# Patient Record
Sex: Male | Born: 1940
Health system: Southern US, Community
[De-identification: ages and names within clinical notes are randomized; demographics above are authoritative.]

## PROBLEM LIST (undated history)

## (undated) DIAGNOSIS — I10 Essential (primary) hypertension: Secondary | ICD-10-CM

## (undated) DIAGNOSIS — K5790 Diverticulosis of intestine, part unspecified, without perforation or abscess without bleeding: Secondary | ICD-10-CM

## (undated) DIAGNOSIS — S82892A Other fracture of left lower leg, initial encounter for closed fracture: Secondary | ICD-10-CM

## (undated) DIAGNOSIS — E782 Mixed hyperlipidemia: Secondary | ICD-10-CM

## (undated) DIAGNOSIS — I2510 Atherosclerotic heart disease of native coronary artery without angina pectoris: Secondary | ICD-10-CM

## (undated) DIAGNOSIS — T7840XA Allergy, unspecified, initial encounter: Secondary | ICD-10-CM

## (undated) DIAGNOSIS — R011 Cardiac murmur, unspecified: Secondary | ICD-10-CM

## (undated) DIAGNOSIS — G001 Pneumococcal meningitis: Secondary | ICD-10-CM

## (undated) HISTORY — DX: Essential (primary) hypertension: I10

## (undated) HISTORY — PX: TONSILLECTOMY: SUR1361

## (undated) HISTORY — DX: Other fracture of left lower leg, initial encounter for closed fracture: S82.892A

## (undated) HISTORY — DX: Atherosclerotic heart disease of native coronary artery without angina pectoris: I25.10

## (undated) HISTORY — DX: Diverticulosis of intestine, part unspecified, without perforation or abscess without bleeding: K57.90

## (undated) HISTORY — DX: Mixed hyperlipidemia: E78.2

## (undated) HISTORY — DX: Cardiac murmur, unspecified: R01.1

## (undated) HISTORY — PX: ADENOIDECTOMY: SUR15

## (undated) HISTORY — DX: Allergy, unspecified, initial encounter: T78.40XA

## (undated) HISTORY — DX: Pneumococcal meningitis: G00.1

## (undated) HISTORY — PX: OTHER SURGICAL HISTORY: SHX169

## (undated) HISTORY — PX: COLONOSCOPY: SHX174

---

## 2001-08-11 ENCOUNTER — Other Ambulatory Visit: Admission: RE | Admit: 2001-08-11 | Discharge: 2001-08-11 | Payer: Self-pay | Admitting: Family Medicine

## 2004-09-30 ENCOUNTER — Ambulatory Visit: Payer: Self-pay | Admitting: Gastroenterology

## 2004-11-12 ENCOUNTER — Ambulatory Visit: Payer: Self-pay | Admitting: Gastroenterology

## 2007-05-21 HISTORY — PX: CORONARY ARTERY BYPASS GRAFT: SHX141

## 2007-05-22 ENCOUNTER — Ambulatory Visit: Payer: Self-pay | Admitting: Cardiology

## 2007-05-22 ENCOUNTER — Inpatient Hospital Stay (HOSPITAL_COMMUNITY): Admission: EM | Admit: 2007-05-22 | Discharge: 2007-05-26 | Payer: Self-pay | Admitting: Emergency Medicine

## 2007-05-23 ENCOUNTER — Ambulatory Visit: Payer: Self-pay | Admitting: Cardiothoracic Surgery

## 2007-06-09 ENCOUNTER — Ambulatory Visit: Payer: Self-pay | Admitting: Cardiology

## 2007-06-22 ENCOUNTER — Encounter: Admission: RE | Admit: 2007-06-22 | Discharge: 2007-06-22 | Payer: Self-pay | Admitting: Cardiothoracic Surgery

## 2007-06-22 ENCOUNTER — Ambulatory Visit: Payer: Self-pay | Admitting: Cardiothoracic Surgery

## 2007-07-10 ENCOUNTER — Ambulatory Visit: Payer: Self-pay | Admitting: Cardiology

## 2007-12-26 DIAGNOSIS — I2510 Atherosclerotic heart disease of native coronary artery without angina pectoris: Secondary | ICD-10-CM | POA: Insufficient documentation

## 2007-12-26 DIAGNOSIS — E785 Hyperlipidemia, unspecified: Secondary | ICD-10-CM | POA: Insufficient documentation

## 2010-04-12 ENCOUNTER — Encounter: Payer: Self-pay | Admitting: Cardiothoracic Surgery

## 2010-08-04 NOTE — Assessment & Plan Note (Signed)
[facility]                          [facility] OFFICE NOTE   AZAAN, LEASK                MRN:          [id number]  DATE:06/09/2007                            DOB:          1940-07-05    PRIMARY CARE PHYSICIAN:  Patrica Duel, M.D.   REASON FOR VISIT:  Follow up bypass surgery.   HISTORY OF PRESENT ILLNESS:  This is my first meeting with Mr. Bessinger.  He is a pleasant 70 year old gentleman, status post recent coronary  artery bypass grafting.  He presented back in early March 2009 with  symptoms of upper back and shoulder discomfort associated with shortness  of breath and was noted to have a significantly abnormal  electrocardiogram at presentation to see Dr. Nobie Putnam in the office.  He  was referred urgently to [facility] for a cardiac  catheterization, which was performed by Dr. Riley Kill.  This revealed  significant multivessel disease, with probable ruptured plaque in the  distal left main coronary artery, with an overall ejection fraction of  45%, associated with inferior segmental wall motion abnormality and very  mild mitral regurgitation.  He was seen by Dr. Donata Clay in consultation  and taken urgently for coronary artery bypass grafting, including a LIMA  to the left anterior descending, saphenous vein graft to the ramus,  saphenous vein graft to the right coronary artery, and saphenous vein  graft to the first and second obtuse marginal branches.  His  postoperative course was overall uncomplicated.  His rhythm remained  stable, and his medical therapy outlined below has been tolerated.  The  patient states he has been compliant with his medications and that has  not been doing any heavy lifting.  He had some trouble sleeping on his  back due to the fact that he has always been used to sleeping on his  side, although he states that his sternal wound is feeling much better.  He has had no progressive lower  extremity edema, although he does have  residual edema on the right, status post vein harvesting.  He has had no  orthopnea or palpitations.  Electrocardiogram shows sinus rhythm, with  left atrial enlargement and nonspecific ST-T wave changes.  I do not  have an old tracing and hand.  He is due to see Dr. Donata Clay next  Friday.   ALLERGIES:  NO KNOWN DRUG ALLERGIES.   PRESENT MEDICATIONS:  1. Aspirin 81 mg p.o. daily.  2. Plavix 75 mg p.o. daily.  3. Lisinopril 10 mg p.o. daily.  4. Metoprolol 25 mg p.o. b.i.d.  5. Simvastatin 20 mg p.o. at bedtime.   REVIEW OF SYSTEMS:  As described in the history of present illness.  Otherwise negative.   PHYSICAL EXAMINATION:  VITAL SIGNS:  Blood pressure is 135/81, heart  rate is 64, weight is 203 pounds.  GENERAL:  The patient is comfortable, looks well.  No acute distress.  HEENT:  Conjunctiva, lids normal.  Oropharynx clear.  NECK:  Supple.  No elevated jugular venous pressure, no loud bruits.  LUNGS:  Clear.  Somewhat diminished breath sounds at the very left base,  but otherwise good air movement.  CARDIAC:  Reveals a regular rate and rhythm.  No loud S3 years or  significant systolic murmur.  No pericardial rub.  ABDOMEN:  Soft.  Examination of the thorax shows a well-healing midline  sternal wound, nonpurulent, no erythema.  Upper abdominal keyhole  incisions are well healing with Steri-Strips in place.  No drainage.  EXTREMITIES:  Exhibit well-healing lower extremity vein harvest sites.  There is 1+ edema on the right, trace on the left.  Distal pulses  otherwise 2+.   IMPRESSION AND RECOMMENDATIONS:  Multivessel coronary disease, with an  ejection fraction of approximately 45%, status post urgent coronary  artery bypass grafting as outlined above.  The patient is recuperating  quite nicely.  His medical therapy is outlined above and is reasonable.  I discussed with him the importance of continuing regular medications  and having  followup long term.  He is due to see Dr. Donata Clay next week  and will have a chest x-ray at that time.  He is off diuretics, and plan  will be for him to continue to increase activity gradually.  I would  like to see him back in one month's time, at which point we will get a  follow-up lipid profile and liver function tests.  Otherwise, continue  regular followup with Dr. Nobie Putnam.     Jonelle Sidle, MD  Electronically Signed    SGM/MedQ  DD: 06/09/2007  DT: 06/09/2007  Job #: [id number]   cc:   Patrica Duel, M.D.

## 2010-08-04 NOTE — H&P (Signed)
NAMERONNALD, SHEDDEN               ACCOUNT NO.:  [id number]   MEDICAL RECORD NO.:  [id number]          PATIENT TYPE:  EMS   LOCATION:  MAJO                         FACILITY:  [facility]   PHYSICIAN:  Thomas C. Wall, MD, FACCDATE OF BIRTH:  [birth year]   DATE OF ADMISSION:  05/22/2007  DATE OF DISCHARGE:                              HISTORY & PHYSICAL   PRIMARY CARE PHYSICIAN:  Patrica Duel, M.D.   PRIMARY CARDIOLOGIST:  Rainey Pines. Wall, M.D.   HISTORY OF PRESENT ILLNESS:  This 70 year old obese Caucasian male  without any known history of coronary artery disease with complaints of  bilateral shoulder pain x3 weeks, most occurring in the morning when  awakens.  He has been noticing it becoming more severe, lasting from 5  minutes to 20 minutes and suddenly began to have a cough and congestion  approximately one week ago.  He was seen by his primary care physician  and x-ray was completed and was told that he had pneumonia and he was  placed on Rocephin and given an IM shot and some p.o. Levaquin.  The  patient continued to have some shoulder pain in the mornings becoming  progressively worse and he went back to see his primary care physician,  saw the P.A. there and an electrocardiogram was completed as the patient  continued to have shoulder pain and some abdominal discomfort along with  heartburn.  EKG revealed significant ST depression, leads V2, V3, V4,  V5, and V6 with evidence of posterior ischemia.  The patient was sent  immediately to [facility] emergency room.  He is currently not having  any chest discomfort.  He is mildly hypertensive with a blood pressure  initially at __________ 104/102.  The patient also had a low grade  temperature when he came in at 99.9 and is currently 101.4.  Blood  pressure now is 133/75.  He was given sublingual nitroglycerin en route  and baby aspirin.  On arrival in the emergency room the patient had  repeat of his EKG which revealed normal  sinus rhythm without evidence of  ischemia or ST depression as seen on EKG at Dr. Geanie Logan office and  through EMS monitoring en route.   The patient has never been seen by a cardiologist before.  He is  normally just followed by his primary care physician, which he does not  see often as well.  He sees primary care on rare occasions.   REVIEW OF SYSTEMS:  Positive for shoulder pain, heartburn, cough and  congestion.  Denies any syncope, near syncope, dyspnea on exertion or  diaphoresis.   PAST MEDICAL HISTORY:  1. Hypertension.  2. Hypercholesterolemia.  3. Obesity.  4. Diverticulosis.   PAST SURGICAL HISTORY:  Tonsillectomy as a child.   SOCIAL HISTORY:  He lives in [place] with his wife.  He is retired.  He  does not smoke.  He does not drink alcohol.   FAMILY HISTORY:  Mother died of a cerebral bleed after falling and  hitting her head.  Father is deceased after having multiple MI's.  He  has  no siblings, he is an only child.   CURRENT MEDICATIONS:  At home:  1. Metoprolol 50 mg b.i.d.  2. Caduet 5/20 mg daily.  3. Benicar 40 mg daily.  4. Thiamine 10 mg daily.   ALLERGIES:  No known drug allergies.   LABORATORY DATA:  Currently only available now are hemoglobin and  hematocrit at 14.9 and 43.6 with white blood cells 10.3 and platelet  count of 252,000.   PHYSICAL EXAMINATION:  VITAL SIGNS:  Blood pressure now 133/75, heart  rate 100, respirations 20, temperature 101.4.  HEENT:  Head is normocephalic, atraumatic.  Eyes: Pupils equal, round,  reactive to light and accommodation.  Mucous membranes mouth pink and  moist.  Tongue is midline.  NECK:  Supple.  No JVD.  No carotid bruits are appreciated.  CARDIOVASCULAR:  Tachycardic.  Regular rhythm and rate without murmurs,  rubs or gallops.  Pulses are 2+ and equal bilaterally.  There are no  abdominal bruits appreciated.  LUNGS:  Are essentially clear to auscultation without wheezes, rhonchi  or rales.  There is no  cough.  ABDOMEN:  Obese, mildly distended with 2+ bowel sounds.  EXTREMITIES:  Without cyanosis, clubbing or edema.  Dorsalis pedis  pulses and radial pulses are 2+ bilaterally.  NEUROLOGICAL:  Cranial nerves II-XII grossly intact.   EKG revealing significant ST depression, leads V2, V3, V4, V5, and V6  with mild elevation in lead III.   IMPRESSION:  1. 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  2. Hypertension.  3. Hypercholesterolemia.  4. Obesity.   PLAN:  The patient has been seen and examined by myself and Dr. Juanito Doom  in the emergency room.  The patient's EKG shows clear anterior lateral  ST depression indicative of inferior posterior myocardial infarction.  The patient also is having some shoulder pain intermittently x3 weeks  with recent treatment for pneumonia.  The patient returned to primary  care physician's office after no improvement in symptoms with abnormal  EKG.   The patient will be started on nitroglycerin, heparin, given baby  aspirin and given Lopressor 5 mg IV x3 with p.o. Lopressor thereafter.  Dr. Daleen Squibb spoke with the cardiac catheterization lab to add patient on  for cardiac catheterization this afternoon.  In the interim, the patient  will be monitored closely in the emergency room and placed in stepdown.  The patient has had a conversation with Dr. Daleen Squibb who explained the risks  and benefits to the patient. He verbalizes understanding and is willing  to be admitted and undergo cardiac catheterization.  More  recommendations will be completed per hospital course and results of  catheterization.      Bettey Mare. Lyman Bishop, NP      Jesse Sans. Daleen Squibb, MD, [facility]  Electronically Signed    KML/MEDQ  D:  05/22/2007  T:  05/22/2007  Job:  [id number]   cc:   Patrica Duel, M.D.  Thomas C. Wall, MD, [facility]

## 2010-08-04 NOTE — Assessment & Plan Note (Signed)
OFFICE VISIT   Fernando [name], Fernando [name]  DOB:  1940-11-20                                        June 22, 2007  CHART #:  [id number]   CURRENT PROBLEMS:  1. Status post emergency coronary artery bypass grafting x5 on May 22, 2007 for 95% stenosis of the left main with unstable angina.  2. Obesity.  3. LVEF of 45% with inferior hypokinesia.   CURRENT MEDICATIONS:  1. Aspirin 81 mg daily.  2. Lopressor 25 b.i.d.  3. Lisinopril 10 mg daily.  4. Zocor 20 mg daily.  5. Plavix 75 mg daily.  6. Multivitamin 1 daily.   HISTORY OF PRESENT ILLNESS:  Fernando [name] is a very nice 70 year old male  who presented with unstable angina and was found by catheterization to  have a tight left main stenosis and underwent emergency coronary artery  bypass grafting. At the time of surgery, the left IMA was grafted to the  LAD and vein grafts were placed at the diagonal, OM1, and OM2, and the  posterior descending. Postoperatively, he did well and was discharged  home on the 4th postoperative day in sinus rhythm, on room air, and  ambulating 300 to 400 feet at a time.  Since returning home, he has had  no recurrent chest pain and the surgical incisions are healing well. He  has lost 15 pounds and is interested in attending the outpatient  rehabilitation program at [facility] in [place]. He is a non-smoker.   PHYSICAL EXAMINATION:  VITAL SIGNS:  Blood pressure 150/88, pulse 80,  respiratory rate 18, saturation 99%.  GENERAL:  He is alert and pleasant. He looks great.  LUNGS:  Breath sounds are clear and equal. The sternum is stable and  well healed.  ABDOMEN:  Obese.  EXTREMITIES:  He has good range of motion of each upper extremity. The  leg incisions are healing well and there is no peripheral edema.  CARDIAC:  Rhythm is regular without S3, gallop or rub.   DIAGNOSTIC STUDIES:  A PA and lateral chest x-ray taken today shows  clear lung fields without pleural  effusion. The sternal wires are well  aligned and intact. The cardiac silhouette is stable.   PLAN:  The patient has had a good recovery in his first month after  emergency multi-vessel coronary artery bypass surgery. He is now able to  resume driving and light daily activities and is encouraged to attend  the outpatient rehabilitation program at [facility]. I told him  that he could stop taking the Plavix after his prescription runs out,  which was provided to him since he presented with acute coronary  syndrome. He knows he is to take the aspirin indefinitely. 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 Otherwise, he will return here as needed.   Kerin Perna, M.D.  Electronically Signed   PV/MEDQ  D:  06/22/2007  T:  06/22/2007  Job:  [id number]   cc:   Jonelle Sidle, MD

## 2010-08-04 NOTE — Discharge Summary (Signed)
Fernando [name], Fernando [name]               ACCOUNT NO.:  [id number]   MEDICAL RECORD NO.:  [id number]          PATIENT TYPE:  INP   LOCATION:  1843                         FACILITY:  [facility]   PHYSICIAN:  Thomas C. Wall, MD, FACCDATE OF BIRTH:  Jun 03, 1940   DATE OF ADMISSION:  05/22/2007  DATE OF DISCHARGE:                               DISCHARGE SUMMARY   ADDENDUM:  To STAT cardiology H&P.   PRIMARY CARDIOLOGIST:  Jesse Sans. Wall, MD, [facility].   PRIMARY CARE PHYSICIAN:  Patrica Duel, M.D.   Review of the patient's records revealed that there was an error in his  history.  In fact, the wrong documentation was placed in the chart.  The  patient has no prior cardiac history, no prior history of hypertension,  no prior history of diabetes or hypercholesterolemia.   The patient is on no medications at home with the exception of recent  doses of Levaquin secondary to presumed pneumonia.   LABORATORY:  Available at this time, hemoglobin 14.9, hematocrit 43.6,  white blood cells 10.3, platelets 252.  Sodium 137, potassium 4.3,  chloride 103, CO2 26, BUN 4, creatinine 0.71.  CK 58, MB 2.9, troponin  0.06.   This addendum is to correct errors stating the patient has a prior  history of hypertension, hypercholesterolemia.      Bettey Mare. Lyman Bishop, NP      Jesse Sans. Daleen Squibb, MD, [facility]  Electronically Signed    KML/MEDQ  D:  05/22/2007  T:  05/22/2007  Job:  [id number]

## 2010-08-04 NOTE — Cardiovascular Report (Signed)
NAMEROBERTS, BON               ACCOUNT NO.:  [id number]   MEDICAL RECORD NO.:  [id number]          PATIENT TYPE:  INP   LOCATION:  2550                         FACILITY:  [facility]   PHYSICIAN:  Arturo Morton. Riley Kill, MD, FACCDATE OF BIRTH:  02-01-1941   DATE OF PROCEDURE:  05/22/2007  DATE OF DISCHARGE:                            CARDIAC CATHETERIZATION   INDICATIONS:  Fernando [name] is a 70 year old gentleman who has had a  several week history of recurrent discomfort.  He came to the office  today and was noted to have marked precordial ST __________.  He was  seen by Dr. Nobie Putnam and transferred promptly to the cardiac unit at  [facility] where he was seen by Dr. Daleen Squibb.  Urgent cardiac  catheterization was recommended.   PROCEDURE:  1. Left heart catheterization  2. Selective coronary arteriography.  3. Selective left ventriculography.  4. Subclavian angiography.   DESCRIPTION OF PROCEDURE:  The patient was brought to the  catheterization laboratory and prepped and draped in the usual fashion.  Through an anterior puncture, the right femoral artery was easily  entered, and a 5-French sheath was placed.  Following this, views of the  left and right coronary arteries were obtained in multiple angiographic  projections.  Subclavian angiography was performed because of probable  need for revascularization surgery.  Central aortic and left ventricular  pressures were measured with pigtail, and  ventriculography was  performed in the RAO projection.  Following this, Dr. Daleen Squibb came into the  room and we summoned Dr. Donata Clay.  He came to the laboratory as well.  Based on the patient's anatomy, it was felt that he needs urgent  revascularization surgery.  He was taken to the holding area in  satisfactory clinical condition for further evaluation and preparation  for __________.  The patient tolerated the procedure extremely well, and  there were no major complications.  He was  somewhat emotional during the  course of the procedure, but the procedure and its findings were  thoroughly explained to the patient, subsequently to the wife, and they  were able to meet Dr. Donata Clay.   HEMODYNAMIC DATA:  1. Central aortic pressure was 163/100, mean 128.  2. Left ventricular pressure was 162/22.  3. With the elevated pressure as well as heart rate, intravenous      metoprolol was administered x2 at 2.5 mg.   There was no obvious gradient on pullback across the aortic valve.   ANGIOGRAPHIC DATA:  1. Ventriculography was done in the RAO projection.  Estimated      ejection fraction would be in the range of 45%.  There was a      segmental inferior wall motion abnormality.  There appeared to be      very mild mitral regurgitation.  2. The subclavian appeared to be patent as is the internal mammary.  3. There was fairly heavy calcification of the coronary arteries      including the proximal portions.  4. The left main was hazy.  There was disease involving the distal      left  main that extends up into the circumflex as well as LAD.      Whether this represents calcium and/or ruptured plaque and hazy      clot is unclear.  There is some slow filling into the marginal      branch which comes right out in the midst of this.  The hazy      irregularity does have the appearance potentially of clot.  5. The left anterior descending artery demonstrates an ostial 80%      stenosis.  This is best seen in the LAO caudal view.  Just beyond      this, there is about 50-70% narrowing and then there is a complex      bifurcational stenosis, and this is also best seen in the LAO      caudal view.  This constitutes the major diagonal as well as the      LAD, and the LAD would be estimated at about 80% and the diagonal      at 70-80% as well.  Fortunately, the distal left anterior      descending artery looks like a reasonable vessel for      revascularization.  Also, the diagonal  appears to be      revascularizable.  6. The circumflex, as noted, demonstrates haziness at the ostium that      extends back into the left main.  There is some slow filling into      the ramus and/or first marginal branch.  The second marginal branch      also has a high-grade 80% stenosis.  Both the first and second      marginal branches appear to be suitable for grafting.  There is 30-      40% narrowing in the AV circumflex leading into the third marginal      branch, and this vessel appears to be graftable as well.  7. The right coronary artery is a fairly large-caliber vessel.      Importantly, there is ostial calcification.  There is a small PDA      and posterolateral system and some perhaps retrograde filling of a      circumflex branch vessel.  The mid right coronary has probably a      60% area of focal stenosis.  This is best seen in the steep LAO      view.  There is an acute marginal branch, but it does not appear to      supply a significant amount of inferior myocardium, and this has      about 90% ostial narrowing.  Dr. Donata Clay and I reviewed the films      here in the laboratory.  8. The subclavian vessel appears to be widely patent with good runoff      into the internal mammary.  There is calcification of the aorta.      There also appears to be mild ostial narrowing of the vertebral.   CONCLUSION:  1. Preserved overall left ventricular function with an ejection      fraction of 45%, inferobasal wall motion abnormality, and mild      mitral regurgitation.  2. Possible ruptured plaque involving the distal left main, proximal      circumflex, ramus, and left anterior descending, as noted above.  3. Complex bifurcational stenosis of the left anterior descending and      diagonal best noted in the left anterior oblique caudal view.  4.  High-grade stenosis of the second obtuse marginal.  5. Mild disease of the third obtuse marginal and mid right coronary.    DISPOSITION:  The patient presented with an acute coronary syndrome.  His EKG is markedly abnormal, suggesting a posterior infarct initially,  and then with stabilization demonstrated normalization of the ST  segments.  He has been seen in consultation by Dr. Donata Clay.  Urgent  revascularization surgery will be considered.      Arturo Morton. Riley Kill, MD, [facility]  Electronically Signed     TDS/MEDQ  D:  05/22/2007  T:  05/23/2007  Job:  [id number]   cc:   Patrica Duel, M.D.  Thomas C. Wall, MD, [facility]  CV Laboratory  Kerin Perna, M.D.

## 2010-08-04 NOTE — Consult Note (Signed)
Fernando [name], [name]               ACCOUNT NO.:  [id number]   MEDICAL RECORD NO.:  [id number]          PATIENT TYPE:  INP   LOCATION:  2550                         FACILITY:  [facility]   PHYSICIAN:  Kerin Perna, M.D.  DATE OF BIRTH:  1941/02/28   DATE OF CONSULTATION:  05/22/2007  DATE OF DISCHARGE:                                 CONSULTATION   REQUESTING PHYSICIAN:  Fernando Morton. Riley Kill, MD, [facility].   PRIMARY CARE PHYSICIAN:  Fernando [name], M.D.   REASON FOR CONSULTATION:  Severe left main and three-vessel coronary  disease.   CHIEF COMPLAINT:  Shoulder pain.   HISTORY OF PRESENT ILLNESS:  I was asked to evaluate this 70 year old  white male nonsmoker for potential emergency coronary artery bypass  surgery for recently diagnosed severe left main and three-vessel  coronary artery disease.  The patient has been having shoulder and upper  back pain for the past three weeks.  He has been treated with  antibiotics (Levaquin) without much improvement.  He was seen by his  primary care physician today and a 12-lead EKG showed diffuse and severe  ST-segment depression in the anterior and lateral leads.  He was given  four 81 mg aspirin tablets and transported to [facility] where emergency cardiac catheterization by Dr. Bonnee Quin  demonstrated critical 95% stenosis of the left main with probable  thrombus, and 80% stenosis of the LAD diagonal bifurcation, and 80%  stenosis of the OM1 and OM II, and a 50-70% stenosis of the mid RCA.  There is inferior and apical hypokinesia.  The patient had improvement  in the ST-segment changes when he was given heparin and IV Lopressor.  Following cardiac catheterization, it was felt the patient would benefit  from emergency heart bypass surgery based on his coronary anatomy and  symptoms.  The 5-French sheath was left in the right femoral artery.   PAST MEDICAL HISTORY:  1. No known medicines.  2. No allergies.  3. Status post  tonsillectomy at age 62.  4. Left ankle fracture repair with a cast.   SOCIAL HISTORY:  Nonsmoker, nondrinker, retired Chartered certified accountant.  He is  married and lives with his wife.   REVIEW OF SYSTEMS:  No weight loss or fever.  No night sweats.  ENT:  Review is negative for dental problems or difficulty swallowing.  THORACIC:  Review is negative for chest trauma or abnormal chest x-ray.  He has had some cough recently.  CARDIAC:  Review is positive for his  unstable angina with back pain and neck pain.  No history cardiac murmur  or arrhythmia.  GI:  Review is positive diverticulosis negative for  hepatitis, jaundice or blood per rectum.  ENDOCRINE:  Review is negative  diabetes or thyroid disease.  UROLOGIC:  Review is negative for BPH or  kidney stones.  VASCULAR:  Review is negative DVT, claudication or TIA.  NEUROLOGIC:  Review is negative stroke or seizure.  HEMATOLOGIC:  Review  is negative bleeding disorder or blood transfusion.   PHYSICAL EXAMINATION:  VITAL SIGNS:  The patient is 5 feet  10 and weighs  215 pounds.  Blood pressure 140/70, pulse 80 and sinus.  GENERAL APPEARANCE:  That of a pleasant middle-aged male in the cath lab  in no acute distress.  He is anxious.  HEENT:  Exam is normocephalic.  NECK:  Without JVD, mass or bruit.  LYMPHATICS:  No palpable adenopathy.  LUNGS:  Breath sounds are clear.  THORACIC:  Exam is without deformity.  CARDIAC:  Exam is with regular rhythm without S3 gallop or murmur.  ABDOMEN:  Soft without pulsatile mass.  EXTREMITIES:  Positive pulses.  Negative for clubbing, cyanosis or  edema.  He has a sheath in the right groin.  NEUROLOGIC:  Exam is intact.   LABORATORY DATA:  I reviewed the coronary arteriograms with Dr. Riley [name]  and he had severe three-vessel disease, left main stenosis and mild to  moderate duction in LV function.   PLAN:  The patient will be prepared for emergency multivessel coronary  bypass surgery.  I discussed the  indications and benefits of this  operation in detail with the patient and his wife.  I reviewed the major  surgical issues including the location of the surgical incisions, use of  general anesthesia and cardiopulmonary bypass, and the expected  postoperative hospital recovery.  I also reviewed the risks of bleeding,  MI, stroke, blood transfusion requirement, and death.  He understands  the alternatives to the surgery and the reasons to proceed.      Kerin Perna, M.D.  Electronically Signed     PV/MEDQ  D:  05/22/2007  T:  05/23/2007  Job:  [id number]

## 2010-08-04 NOTE — Op Note (Signed)
NAMESHANNAN, GARFINKEL               ACCOUNT NO.:  [id number]   MEDICAL RECORD NO.:  [id number]          PATIENT TYPE:  INP   LOCATION:  2314                         FACILITY:  [facility]   PHYSICIAN:  Kerin Perna, M.D.  DATE OF BIRTH:  1940-05-21   DATE OF PROCEDURE:  05/22/2007  DATE OF DISCHARGE:                               OPERATIVE REPORT   OPERATION:  1. Emergency coronary artery bypass grafting x5 (left internal mammary      artery to left anterior descending, saphenous vein graft to      diagonal, sequential saphenous vein graft to obtuse marginal 1 and      obtuse marginal 2, saphenous vein graft to posterior descending).  2. Endoscopic harvest of the greater saphenous vein from the right leg      and the left leg.   SURGEON:  Kerin Perna, M.D.   ASSISTANT:  Coral Ceo, PA-C   ANESTHESIA:  General.   POSTOPERATIVE DIAGNOSIS:  Unstable angina with 95% stenosis of the left  main and three-vessel coronary disease.   INDICATIONS:  The patient is a 70 year old obese man with 2-3 weeks of  upper back and shoulder pain especially with exertion.  An EKG at his  primary care physician's today showed severe ST-segment changes and he  underwent emergency cardiac catheterization which showed a 95% stenosis  of the left main with probable thrombus as well as three-vessel coronary  disease.  His EF was 45-50% with some inferior apical hypokinesia.  He  is felt to be a candidate for emergency coronary bypass surgery.  I  examined the patient in the cath lab and discussed the situation with  the patient and his wife.  I discussed the expected benefits and  indications for coronary bypass surgery for treatment of his severe  coronary disease.  I reviewed the alternatives to surgical therapy as  well.  I discussed the major aspects of the planned procedure including  the location of the surgical incisions, use of general anesthesia and  cardiopulmonary bypass, and expected  postoperative recovery period.  I  reviewed the risks to him of MI, CVA, bleeding, blood transfusion  requirement, infection, and death.  He understood these implications and  agreed to proceed with surgery under what I felt was an informed  consent.   OPERATIVE FINDINGS:  Saphenous vein was harvested from both legs as it  became too small to use below the knee.  The mammary artery was tortuous  but had good flow.  The coronaries were heavily diseased.  The patient  received a unit of platelets after reversal of the heparin with  protamine due to persistent coagulopathy.   PROCEDURE:  The patient was brought to operating room and placed supine  on the operating table where general anesthesia was induced.  The chest,  abdomen and legs were prepped with Betadine and draped as a sterile  field.  A sternal incision was made as the saphenous vein was harvested  endoscopically from both legs.  The left internal mammary artery was  harvested as a pedicle graft from its origin at  the subclavian vessels.  The sternal retractor was placed and the pericardium was opened and  suspended.  Pursestrings were placed in the ascending aorta and right  atrium and heparin was administered.  When the vein had been harvested  and found to be adequate, the patient was cannulated and placed on  bypass.  The coronaries were identified for grafting and cardioplegia  catheters were placed for both antegrade and retrograde cold blood  cardioplegia.  The patient was cooled to 32 degrees.  The mammary artery  and vein grafts were prepared for the distal anastomoses.  Crossclamp  was applied.   800 mL of cold blood cardioplegia was delivered in split doses between  the antegrade aortic and retrograde coronary sinus catheters.  There is  good cardioplegic arrest and septal temperature dropped less than 10  degrees.  Cardioplegia was then delivered every 20 minutes or less while  the crossclamp was applied.   The  distal coronary anastomoses were performed.  First distal  anastomosis was to the posterior descending branch of the right  coronary.  This had a proximal 70% stenosis.  A reverse saphenous vein  was sewn end-to-side with running 7-0 Prolene with good flow through  graft.  The second distal anastomosis was to the diagonal branch of the  LAD.  This had a proximal 80% stenosis and a reverse saphenous vein was  sewn end-to-side with running 7-0 Prolene with good flow through graft.  The third and fourth distal anastomoses consisted of a sequential vein  graft to the OM1 and OM2.  The OM1 was a large 1.6-1.7 mm vessel with  proximal 80% stenosis.  A side-to-side anastomosis with the vein was  sewn with a running 7-0 Prolene with good flow through graft.  This was  then continued in a sequential fashion to the distal circumflex - OM2.  This was a smaller 1.4-mm vessel and the end of the vein was sewn end-to-  side with running 7-0 Prolene.  This good flow through the sequential  vein graft and cardioplegia was redosed.  The fourth distal anastomosis  was to the distal third of the LAD.  It was a 1.5 mm vessel the proximal  80% stenosis.  Left IMA pedicle was brought through an opening created  in the left lateral pericardium was brought down onto the LAD and sewn  end-to-side with running 8-0 Prolene.  There was good flow through the  anastomosis after briefly releasing the pedicle bulldog on the mammary  artery.  The mammary was secured to the epicardium and the bulldog was  reapplied.  Cardioplegia was redosed.   While the crossclamp was still in place, three proximal vein anastomoses  were performed on the ascending aorta using a 4.0-mm punch and running 7-  0 Prolene.  Prior to tying down the final proximal anastomosis, air was  vented from the coronaries with a dose of retrograde warm blood  cardioplegia.  The final proximal anastomosis was tied and the  crossclamp was removed.   The  heart was reperfused and rewarmed.  The heart was cardioverted back  to a regular rhythm.  Air was aspirated from the vein grafts and each  was checked and had good flow and hemostasis was documented at the  proximal and distal anastomoses.  Temporary pacing wires were applied.  When the patient was adequately rewarmed and reperfused, the lungs were  re-expanded.  The ventilator was resumed.  The patient was then weaned  from bypass without difficulty  with stable blood pressure and cardiac  output.  Protamine was administered without adverse reaction.  The  cannulas were removed.  The mediastinum was irrigated with warm  irrigation.  The leg incisions were irrigated and closed in a standard  fashion.  The superior pericardial fat was closed over the aorta.  Two  mediastinal and left pleural chest tube placed and brought through  separate incisions.  The sternum was closed with interrupted steel wire.  The pectoralis fascia was closed in running #1 Vicryl.  Subcutaneous and  skin layers closed with a running Vicryl and sterile dressings were  applied.  Total bypass time was 145 minutes with crossclamp time of 108  minutes.      Kerin Perna, M.D.  Electronically Signed     PV/MEDQ  D:  05/22/2007  T:  05/23/2007  Job:  [id number]   cc:   Arturo Morton. Riley Kill, MD, [facility]

## 2010-08-04 NOTE — Assessment & Plan Note (Signed)
[facility]                          [facility] OFFICE NOTE   Fernando [name], Fernando [name]                MRN:          [id number]  DATE:07/10/2007                            DOB:          20-Jul-1940    PRIMARY CARE PHYSICIAN:  Dr. Patrica Duel.   REASON FOR VISIT:  Coronary artery disease.   HISTORY OF PRESENT ILLNESS:  Fernando [name] comes back in with his wife.  Since his last visit, he saw Dr. Donata Clay.  He had a chest x-ray done  at that time that by report showed no evidence of significant pleural  effusions with intact sternal wires.  Fernando [name] continues to make good  progress.  He has since stopped his Plavix and is tolerating his  remaining medications.  We did do follow-up fasting lipid profile and  liver function tests, and his LDL was down to 99.  He had normal liver  function tests.  Today, we went over as medications, and I talked about  more aggressive cholesterol control over time.  He prefers to wait about  three more months to see how his LDL settles out before we increase his  simvastatin.  Otherwise, I also asked him to keep an eye on his blood  pressure.  He has not seen Dr. Nobie Putnam back in the office as yet.   ALLERGIES:  No known drug allergies.   MEDICATIONS:  1. Aspirin 81 mg p.o. daily.  2. Lisinopril 10 mg p.o. daily.  3. Metoprolol 25 mg p.o. b.i.d.  4. Simvastatin 20 mg p.o. daily.   REVIEW OF SYSTEMS:  As described in the present illness.  All others  negative.   PHYSICAL EXAMINATION:  BP 145/85, heart rate is 83, weight 204 pounds.  The patient is comfortable in no acute distress.  Examination of neck reveals no elevated jugular venous pressure, no loud  bruits.  LUNGS:  Are clear.  CHEST:  Wall is well-healed.  CARDIAC:  Reveals a regular rate and rhythm.  No S3 gallop.  No loud  murmur.  EXTREMITIES:  Exhibit trace edema.   IMPRESSION AND RECOMMENDATIONS:  Multivessel cardiovascular disease with  an  ejection fraction of 45% status post urgent coronary bypass grafting  in March 2009.  He is doing quite well and will continue his present  medications.  I have asked him to keep an eye on his blood pressure with  Dr. Nobie Putnam.  He may need further up titration of his ACE inhibitor.  He will also come back in over the next three months with a fasting  lipid profile and liver function tests, and we might need to advance his  simvastatin further at that time for goal LDL reduction around  70.  He sounds as if he is trying to modify his diet and stay active.  His wife is also helping him with this.  I asked him to make sure he  gets back in to see Dr. Nobie Putnam in the interim.     Jonelle Sidle, MD  Electronically Signed    SGM/MedQ  DD: 07/10/2007  DT: 07/10/2007  Job #: [id number]  cc:   Bonne Dolores, M.D.

## 2010-08-04 NOTE — Discharge Summary (Signed)
Fernando [name], Fernando [name] NO.:  [id number]   MEDICAL RECORD NO.:  [id number]          PATIENT TYPE:  INP   LOCATION:  2016                         FACILITY:  [facility]   PHYSICIAN:  Kerin Perna, M.D.  DATE OF BIRTH:  Jan 27, 1941   DATE OF ADMISSION:  05/22/2007  DATE OF DISCHARGE:  05/26/2007                               DISCHARGE SUMMARY   HISTORY OF THE PRESENT ILLNESS:  The patient is a 70 year old male who  was seen by his primary physician with complaints of bilateral shoulder  pain for approximately 3 weeks.  Electrocardiogram showed abnormal  changes with ST depressions.  He was sent emergently to the [facility] for further evaluation and treatment.   PAST MEDICAL HISTORY:  1. Obesity.  2. Diverticulitis.   PAST SURGICAL HISTORY:  Tonsillectomy.   SOCIAL HISTORY:  1. He is a nonsmoker.  2. He does not use alcohol.   ALLERGIES:  No known drug allergies.   MEDICATIONS PRIOR TO ADMISSION:  None.   FAMILY HISTORY/SOCIAL HISTORY/REVIEW OF SYMPTOMS AND PHYSICAL EXAM:  Please see the History & Physical done at the time of admission.   HOSPITAL COURSE:  The patient was admitted, taken urgently to cardiac  catheterization where he was found to have severe coronary artery  disease including a 95% left main lesion, 80% proximal LAD, 70% proximal  obtuse marginal #1, 70% mid right coronary artery and 75% diagonal  lesion.  Left ventricular ejection fraction was 50% with anterior apical  mild hypokinesis.  Due to these findings surgical consultation was  obtained and he was evaluated by Dr. Kathlee Nations Trigt.  He recommended  proceeding with urgent surgical revascularization.   PROCEDURE:  On May 22, 2007, patient was taken to the operating room,  at which time he underwent the following procedure:  Emergent coronary  artery bypass grafting x5.  The following grafts were placed:  1. Left internal mammary artery to the LAD.  2. Saphenous vein graft to  the ramus.  3. Saphenous vein graft to the right coronary artery.  4. Sequential saphenous vein graft to obtuse marginal 1 and obtuse      marginal 2.   The patient tolerated the procedure well, was taken to the surgical  intensive care unit in stable condition.   POSTOPERATIVE HOSPITAL COURSE:  The patient has overall done quite well.  He has remained hemodynamically stable.  He is responding well to a  gentle diuresis.  His oxygen has been weaned and he maintains good  saturations on room air.  He is tolerating routine cardiac  rehabilitation phase I modalities.  He has a mild  postoperative acute  blood loss anemia.  Most recent hemoglobin and hematocrit is stable at  11.1 and 32.5 respectively.  He has maintained normal sinus rhythm  without significant cardiac dysrhythmias.  He is felt to be stable for  discharge on today's date, May 26, 2007.   INSTRUCTIONS:  The patient will receive written instructions regarding  medications, activity, diet, wound care and followup.   FOLLOWUP:  Will include:  1. Dr. Zenaida Niece  Trigt in 3 weeks.  2. Dr. Juanito Doom in 3 weeks.  3. He is instructed to follow up with Dr. Nobie Putnam in 1 month.   MEDICATIONS AT DISCHARGE ARE AS FOLLOWS:  1. Aspirin 81 mg daily.  2. Lopressor 25 mg twice daily.  3. Lisinopril 10 mg daily.  4. Zocor 20 mg daily.  5. Lasix 40 mg daily for 5 days.  6. Potassium chloride 20 mEq daily for 5 days.  7. Plavix 75 mg daily.  8. Multivitamin one daily.  9. Ceftin 500 mg twice daily for an additional 5 days.   FINAL DIAGNOSIS:  Severe multivessel coronary artery disease as  described above, now status post surgical revascularization.   OTHER DIAGNOSES:  1. Acute blood loss anemia.  2. Obesity.  3. Diverticulosis.  4. History of previous tonsillectomy and adenoidectomy.  5. History of previous left ankle fracture without surgery.      Rowe Clack, P.A.-C.      Kerin Perna, M.D.  Electronically Signed     WEG/MEDQ  D:  05/26/2007  T:  05/26/2007  Job:  [id number]   cc:   Kerin Perna, M.D.  Thomas C. Daleen Squibb, MD, [facility]  Patrica Duel, M.D.

## 2010-12-14 LAB — POCT I-STAT 4, (NA,K, GLUC, HGB,HCT)
Glucose, Bld: 113 — ABNORMAL HIGH
Glucose, Bld: 144 — ABNORMAL HIGH
HCT: 31 — ABNORMAL LOW
HCT: 37 — ABNORMAL LOW
Hemoglobin: 12.6 — ABNORMAL LOW
Hemoglobin: 8.2 — ABNORMAL LOW
Hemoglobin: 8.2 — ABNORMAL LOW
Hemoglobin: 8.2 — ABNORMAL LOW
Operator id: 123161
Operator id: 3402
Operator id: 3402
Operator id: 3402
Potassium: 4.4
Potassium: 4.5
Sodium: 138
Sodium: 138
Sodium: 139
Sodium: 139

## 2010-12-14 LAB — CBC
HCT: 29.3 — ABNORMAL LOW
HCT: 30.6 — ABNORMAL LOW
HCT: 31.6 — ABNORMAL LOW
HCT: 32 — ABNORMAL LOW
HCT: 32.5 — ABNORMAL LOW
Hemoglobin: 10.1 — ABNORMAL LOW
Hemoglobin: 10.6 — ABNORMAL LOW
Hemoglobin: 10.8 — ABNORMAL LOW
Hemoglobin: 10.8 — ABNORMAL LOW
Hemoglobin: 11.1 — ABNORMAL LOW
MCHC: 33.8
MCHC: 34.1
MCHC: 34.1
MCHC: 34.2
MCHC: 34.3
MCHC: 34.6
MCV: 90.5
MCV: 91.1
MCV: 91.5
MCV: 91.8
MCV: 91.9
MCV: 92.2
Platelets: 150
Platelets: 154
Platelets: 161
Platelets: 166
Platelets: 177
Platelets: 252
RBC: 3.17 — ABNORMAL LOW
RBC: 3.38 — ABNORMAL LOW
RBC: 3.44 — ABNORMAL LOW
RBC: 3.5 — ABNORMAL LOW
RBC: 3.53 — ABNORMAL LOW
RDW: 12.7
RDW: 12.8
RDW: 12.8
RDW: 12.9
RDW: 13.1
RDW: 13.3
WBC: 11.9 — ABNORMAL HIGH
WBC: 13.2 — ABNORMAL HIGH
WBC: 13.3 — ABNORMAL HIGH
WBC: 14.2 — ABNORMAL HIGH
WBC: 15.4 — ABNORMAL HIGH

## 2010-12-14 LAB — BASIC METABOLIC PANEL
BUN: 3 — ABNORMAL LOW
BUN: 4 — ABNORMAL LOW
BUN: 6
BUN: 9
CO2: 22
CO2: 24
CO2: 26
CO2: 30
Calcium: 6.6 — ABNORMAL LOW
Calcium: 7.3 — ABNORMAL LOW
Calcium: 8.6
Calcium: 9.2
Chloride: 101
Chloride: 103
Chloride: 105
Chloride: 111
Creatinine, Ser: 0.6
Creatinine, Ser: 0.61
Creatinine, Ser: 0.71
Creatinine, Ser: 0.77
GFR calc Af Amer: >60
GFR calc Af Amer: >60
GFR calc Af Amer: >60
GFR calc non Af Amer: >60
GFR calc non Af Amer: >60
GFR calc non Af Amer: >60
Glucose, Bld: 116 — ABNORMAL HIGH
Glucose, Bld: 139 — ABNORMAL HIGH
Glucose, Bld: 99
Potassium: 3.2 — ABNORMAL LOW
Potassium: 3.9
Potassium: 4.4
Sodium: 135
Sodium: 136
Sodium: 137

## 2010-12-14 LAB — I-STAT 8, (EC8 V) (CONVERTED LAB)
BUN: 5 — ABNORMAL LOW
Chloride: 100
Glucose, Bld: 112 — ABNORMAL HIGH
Hemoglobin: 10.9 — ABNORMAL LOW
Potassium: 3.7
Sodium: 136
pH, Ven: 7.417 — ABNORMAL HIGH

## 2010-12-14 LAB — CREATININE, SERUM
Creatinine, Ser: 0.67
GFR calc Af Amer: >60
GFR calc non Af Amer: >60

## 2010-12-14 LAB — CROSSMATCH: Antibody Screen: NEGATIVE

## 2010-12-14 LAB — POCT I-STAT 3, ART BLOOD GAS (G3+)
Acid-base deficit: 1
Acid-base deficit: 2
Bicarbonate: 22.9
Bicarbonate: 23.5
Bicarbonate: 23.9
O2 Saturation: 100
O2 Saturation: 100
O2 Saturation: 100
Patient temperature: 36.5
Patient temperature: 36.7
TCO2: 24
TCO2: 25
TCO2: 26
pCO2 arterial: 36.1
pCO2 arterial: 37.1
pH, Arterial: 7.379
pH, Arterial: 7.408
pO2, Arterial: 108 — ABNORMAL HIGH
pO2, Arterial: 116 — ABNORMAL HIGH
pO2, Arterial: 326 — ABNORMAL HIGH
pO2, Arterial: 358 — ABNORMAL HIGH

## 2010-12-14 LAB — DIFFERENTIAL
Basophils Relative: 1
Eosinophils Absolute: 0
Eosinophils Relative: 0
Monocytes Absolute: 0.6
Monocytes Relative: 6

## 2010-12-14 LAB — BLOOD GAS, ARTERIAL
Acid-base deficit: 0.9
O2 Content: 2
O2 Saturation: 98.9
pCO2 arterial: 31.7 — ABNORMAL LOW

## 2010-12-14 LAB — HEMOGLOBIN AND HEMATOCRIT, BLOOD
HCT: 24.7 — ABNORMAL LOW
Hemoglobin: 8.6 — ABNORMAL LOW

## 2010-12-14 LAB — MAGNESIUM
Magnesium: 2.2
Magnesium: 2.6 — ABNORMAL HIGH

## 2010-12-14 LAB — APTT
aPTT: 31
aPTT: 33

## 2010-12-14 LAB — PROTIME-INR
INR: 1.4
Prothrombin Time: 17 — ABNORMAL HIGH

## 2010-12-14 LAB — CK TOTAL AND CKMB (NOT AT ARMC)
Relative Index: INVALID
Total CK: 58

## 2011-05-06 ENCOUNTER — Emergency Department (HOSPITAL_COMMUNITY)
Admission: EM | Admit: 2011-05-06 | Discharge: 2011-05-06 | Disposition: A | Discharge status: Home / Self Care | Payer: Medicare Other | Attending: Emergency Medicine | Admitting: Emergency Medicine

## 2011-05-06 ENCOUNTER — Encounter (HOSPITAL_COMMUNITY): Payer: Self-pay | Admitting: *Deleted

## 2011-05-06 ENCOUNTER — Emergency Department (HOSPITAL_COMMUNITY): Payer: Medicare Other

## 2011-05-06 ENCOUNTER — Other Ambulatory Visit: Payer: Self-pay

## 2011-05-06 DIAGNOSIS — R079 Chest pain, unspecified: Secondary | ICD-10-CM | POA: Insufficient documentation

## 2011-05-06 DIAGNOSIS — R002 Palpitations: Secondary | ICD-10-CM | POA: Insufficient documentation

## 2011-05-06 LAB — DIFFERENTIAL
Lymphocytes Relative: 43 % (ref 12–46)
Lymphs Abs: 3.4 10*3/uL (ref 0.7–4.0)
Monocytes Relative: 9 % (ref 3–12)
Neutro Abs: 3.7 10*3/uL (ref 1.7–7.7)
Neutrophils Relative %: 46 % (ref 43–77)

## 2011-05-06 LAB — CBC
Hemoglobin: 14.7 g/dL (ref 13.0–17.0)
MCH: 30.9 pg (ref 26.0–34.0)
RBC: 4.76 MIL/uL (ref 4.22–5.81)
WBC: 7.9 10*3/uL (ref 4.0–10.5)

## 2011-05-06 LAB — CARDIAC PANEL(CRET KIN+CKTOT+MB+TROPI)
CK, MB: 2.6 ng/mL (ref 0.3–4.0)
Relative Index: INVALID (ref 0.0–2.5)
Troponin I: <0.3 ng/mL (ref <0.30)

## 2011-05-06 LAB — BASIC METABOLIC PANEL
BUN: 9 mg/dL (ref 6–23)
CO2: 25 mEq/L (ref 19–32)
Chloride: 101 mEq/L (ref 96–112)
Glucose, Bld: 116 mg/dL — ABNORMAL HIGH (ref 70–99)
Potassium: 3.7 mEq/L (ref 3.5–5.1)

## 2011-05-06 MED ORDER — ASPIRIN 81 MG PO CHEW
324.0000 mg | CHEWABLE_TABLET | Freq: Once | ORAL | Status: AC
  Administered 2011-05-06: 324 mg via ORAL
  Filled 2011-05-06: qty 4

## 2011-05-06 NOTE — ED Provider Notes (Signed)
History     CSN: [id number]  Arrival date & time 05/06/11  1337   First MD Initiated Contact with Patient 05/06/11 1350      Chief Complaint  Patient presents with  . Chest Pain    (Consider location/radiation/quality/duration/timing/severity/associated sxs/prior treatment) Patient is a 71 y.o. male presenting with chest pain. The history is provided by the patient.  Chest Pain Primary symptoms include palpitations. Pertinent negatives for primary symptoms include no shortness of breath, no abdominal pain, no nausea and no vomiting.  The palpitations did not occur with shortness of breath.  Pertinent negatives for associated symptoms include no numbness and no weakness.    patient states she was on the way to the barbecue restaurant when he started to feel a strange feeling in his right chest. States it felt as if his heart fluttering. He states he became flushed and felt bad. He feels better now. He states is not pain. No lightheadedness. No dizziness. No cough. No fevers. No diaphoresis. His wife states he looked flushed and pale. 4 years ago he had a 5 vessel cardiac bypass. He states he did not have chest pain at that time, only back pain.  History reviewed. No pertinent past medical history.  Past Surgical History  Procedure Date  . Cardiac surgery   . Tonsillectomy     History reviewed. No pertinent family history.  History  Substance Use Topics  . Smoking status: Never Smoker   . Smokeless tobacco: Not on file  . Alcohol Use: No      Review of Systems  Constitutional: Negative for activity change and appetite change.  HENT: Negative for neck stiffness.   Eyes: Negative for pain.  Respiratory: Negative for chest tightness and shortness of breath.   Cardiovascular: Positive for chest pain and palpitations. Negative for leg swelling.  Gastrointestinal: Negative for nausea, vomiting, abdominal pain and diarrhea.  Genitourinary: Negative for flank pain.    Musculoskeletal: Negative for back pain.  Skin: Negative for rash.  Neurological: Negative for weakness, numbness and headaches.  Psychiatric/Behavioral: Negative for behavioral problems.    Allergies  Review of patient's allergies indicates no known allergies.  Home Medications  No current outpatient prescriptions on file.  BP 166/81  Pulse 77  Temp(Src) 98.4 F (36.9 C) (Oral)  Resp 18  Ht 5\' 10"  (1.778 m)  Wt 180 lb (81.647 kg)  BMI 25.83 kg/m2  SpO2 100%  Physical Exam  Nursing note and vitals reviewed. Constitutional: He is oriented to person, place, and time. He appears well-developed and well-nourished.  HENT:  Head: Normocephalic and atraumatic.  Eyes: EOM are normal. Pupils are equal, round, and reactive to light.  Neck: Normal range of motion. Neck supple.  Cardiovascular: Normal rate, regular rhythm and normal heart sounds.   No murmur heard. Pulmonary/Chest: Effort normal and breath sounds normal.       Median sternotomy scar  Abdominal: Soft. Bowel sounds are normal. He exhibits no distension and no mass. There is no tenderness. There is no rebound and no guarding.  Musculoskeletal: Normal range of motion. He exhibits no edema.  Neurological: He is alert and oriented to person, place, and time. No cranial nerve deficit.  Skin: Skin is warm and dry.  Psychiatric: He has a normal mood and affect.    ED Course  Procedures (including critical care time)  Labs Reviewed  BASIC METABOLIC PANEL - Abnormal; Notable for the following:    Glucose, Bld 116 (*)    All other  components within normal limits  CBC  DIFFERENTIAL  CARDIAC PANEL(CRET KIN+CKTOT+MB+TROPI)  TROPONIN I   Dg Chest 2 View  05/06/2011  *RADIOLOGY REPORT*  Clinical Data: Right-sided chest pain  CHEST - 2 VIEW  Comparison: 06/22/2007; 05/25/2007; 05/24/2007  Findings: Grossly unchanged borderline enlarged cardiac silhouette with persistent mild tortuosity of the descending thoracic aorta. Post  median sternotomy and CABG.  There is unchanged mild diffuse increased conspicuity of the pulmonary interstitium without new focal airspace opacities.  There is persistent mild elevation of the right hemidiaphragm.  No pleural effusion or pneumothorax. Unchanged bones.  IMPRESSION: No acute cardiopulmonary disease.  Original Report Authenticated By: Waynard Reeds, M.D.     1. Chest pain      Date: 05/06/2011  Rate: 92  Rhythm: normal sinus rhythm  QRS Axis: normal  Intervals: normal  ST/T Wave abnormalities: normal  Conduction Disutrbances:none  Narrative Interpretation:   Old EKG Reviewed: unchanged    MDM  Chest pain. Sudden onset of fluttering with a gas-like feeling in his right chest. EKG is unchanged and 2 sets of enzymes are negative. I doubt this is quiet heart disease, but he will need followup with cardiology. An appointment is made for him at 840 tomorrow morning with our cardiology. He'll be discharged        Juliet Rude. Rubin Payor, MD 05/06/11 1710

## 2011-05-06 NOTE — ED Notes (Signed)
Pt sitting up on side of bed, family at bedside, pt denies any complaints.

## 2011-05-06 NOTE — ED Notes (Signed)
Pt denies any complaints, pt and family updated on plan of care./

## 2011-05-06 NOTE — ED Notes (Signed)
Pt presents to er with c/o fluttering or oddness in the right side of his chest. Dr. Rubin Payor in room with pt upon pt arrival to department, see EDP assessment for further

## 2011-05-06 NOTE — ED Notes (Signed)
Dr. Pickering in room with pt and family 

## 2011-05-06 NOTE — ED Notes (Signed)
Sudden onset felt like fluttering in chest.

## 2011-05-06 NOTE — Discharge Instructions (Signed)
Chest Pain (Nonspecific) It is often hard to give a specific diagnosis for the cause of chest pain. There is always a chance that your pain could be related to something serious, such as a heart attack or a blood clot in the lungs. You need to follow up with your caregiver for further evaluation. CAUSES   Heartburn.   Pneumonia or bronchitis.   Anxiety and stress.   Inflammation around your heart (pericarditis) or lung (pleuritis or pleurisy).   A blood clot in the lung.   A collapsed lung (pneumothorax). It can develop suddenly on its own (spontaneous pneumothorax) or from injury (trauma) to the chest.  The chest wall is composed of bones, muscles, and cartilage. Any of these can be the source of the pain.  The bones can be bruised by injury.   The muscles or cartilage can be strained by coughing or overwork.   The cartilage can be affected by inflammation and become sore (costochondritis).  DIAGNOSIS  Lab tests or other studies, such as X-rays, an EKG, stress testing, or cardiac imaging, may be needed to find the cause of your pain.  TREATMENT   Treatment depends on what may be causing your chest pain. Treatment may include:   Acid blockers for heartburn.   Anti-inflammatory medicine.   Pain medicine for inflammatory conditions.   Antibiotics if an infection is present.   You may be advised to change lifestyle habits. This includes stopping smoking and avoiding caffeine and chocolate.   You may be advised to keep your head raised (elevated) when sleeping. This reduces the chance of acid going backward from your stomach into your esophagus.   Most of the time, nonspecific chest pain will improve within 2 to 3 days with rest and mild pain medicine.  HOME CARE INSTRUCTIONS   If antibiotics were prescribed, take the full amount even if you start to feel better.   For the next few days, avoid physical activities that bring on chest pain. Continue physical activities as  directed.   Do not smoke cigarettes or drink alcohol until your symptoms are gone.   Only take over-the-counter or prescription medicine for pain, discomfort, or fever as directed by your caregiver.   Follow your caregiver's suggestions for further testing if your chest pain does not go away.   Keep any follow-up appointments you made. If you do not go to an appointment, you could develop lasting (chronic) problems with pain. If there is any problem keeping an appointment, you must call to reschedule.  SEEK MEDICAL CARE IF:   You think you are having problems from the medicine you are taking. Read your medicine instructions carefully.   Your chest pain does not go away, even after treatment.   You develop a rash with blisters on your chest.  SEEK IMMEDIATE MEDICAL CARE IF:   You have increased chest pain or pain that spreads to your arm, neck, jaw, back, or belly (abdomen).   You develop shortness of breath, an increasing cough, or you are coughing up blood.   You have severe back or abdominal pain, feel sick to your stomach (nauseous) or throw up (vomit).   You develop severe weakness, fainting, or chills.   You have an oral temperature above 102 F (38.9 C), not controlled by medicine.  THIS IS AN EMERGENCY. Do not wait to see if the pain will go away. Get medical help at once. Call your local emergency services (911 in [place] Do not drive yourself to   the hospital. MAKE SURE YOU:   Understand these instructions.   Will watch your condition.   Will get help right away if you are not doing well or get worse.  Document Released: 12/16/2004 Document Revised: 11/18/2010 Document Reviewed: 10/12/2007 Exit[employer] Patient Information 2012 [employer]. 

## 2011-05-07 ENCOUNTER — Ambulatory Visit (INDEPENDENT_AMBULATORY_CARE_PROVIDER_SITE_OTHER): Payer: BLUE CROSS/BLUE SHIELD | Admitting: Cardiology

## 2011-05-07 ENCOUNTER — Encounter: Payer: Self-pay | Admitting: Cardiology

## 2011-05-07 VITALS — BP 176/90 | HR 88 | Resp 16 | Ht 69.0 in | Wt 179.0 lb

## 2011-05-07 DIAGNOSIS — R072 Precordial pain: Secondary | ICD-10-CM | POA: Insufficient documentation

## 2011-05-07 DIAGNOSIS — I2510 Atherosclerotic heart disease of native coronary artery without angina pectoris: Secondary | ICD-10-CM

## 2011-05-07 DIAGNOSIS — E785 Hyperlipidemia, unspecified: Secondary | ICD-10-CM

## 2011-05-07 DIAGNOSIS — I10 Essential (primary) hypertension: Secondary | ICD-10-CM | POA: Insufficient documentation

## 2011-05-07 MED ORDER — NITROGLYCERIN 0.4 MG SL SUBL: 0.4000 mg | SUBLINGUAL_TABLET | SUBLINGUAL | Status: DC | PRN

## 2011-05-07 MED ORDER — ASPIRIN EC 81 MG PO TBEC: 81.0000 mg | DELAYED_RELEASE_TABLET | Freq: Every day | ORAL | Status: AC

## 2011-05-07 NOTE — Assessment & Plan Note (Signed)
Recent LDL was 119, not optimal in light of CAD, although has generally been better with his changes in diet and exercise. He continues on omega-3 supplements. I also spoke with him about considering a low-dose statin. He can continue to review this at his followup.

## 2011-05-07 NOTE — Assessment & Plan Note (Signed)
Atypical in description, although has been recurrent, and he has not undergone any followup ischemic testing since CABG in 2009. Recent ECG reviewed and overall nonspecific. We discussed proceeding with a followup exercise Cardiolite to assess for ischemic burden following CABG. Follow up visit scheduled in the [place] office.

## 2011-05-07 NOTE — Patient Instructions (Signed)
**Note De-Identified Burdette Gergely Obfuscation** Your physician has recommended you make the following change in your medication: start taking Aspirin 81 mg daily and Nitroglycerin 0.4 mg as needed for chest pain (please follow directions on the bottle)  Your physician has requested that you have en exercise stress myoview. For further information please visit [email or web address] Please follow instruction sheet, as given.  Your physician recommends that you schedule a follow-up appointment in: 2 month at our [place] office.

## 2011-05-07 NOTE — Assessment & Plan Note (Signed)
Blood pressure elevated today. He was on antihypertensives in the past. I spoke with the patient and his wife today about more optimal blood pressure control as it relates to reduction in stroke and heart attack risk, also reduction in risk of developing CHF. I asked him to record blood pressures at home with their blood pressure monitor, and bring these to his followup visit. We also discussed salt restriction.

## 2011-05-07 NOTE — Assessment & Plan Note (Signed)
In speaking with Fernando [name] today, it is apparent that he is fairly resistant to taking regular medications. I did discuss with him at least taking an aspirin daily, and also provided him a prescription for p.r.n. Nitroglycerin.

## 2011-05-07 NOTE — Progress Notes (Signed)
   Clinical Summary Fernando [name] is a 71 y.o.male referred to the office after a recent ER visit. I last saw him in our [place] office back in 2009. He reports no regular medical followup over the last year and a half, has not been taking any regular medications other than omega 3 supplements. He does state that he has changed his diet significantly and is exercising, has lost a significant amount of weight. He showed me a recent LDL of 119.  He is here with his wife. He denies any reproducible exertional chest pain, however states that occasionally he feels a "raw" sensation in his right chest, had a recent episode associated with dizziness that prompted him to seek evaluation at the [facility] ER. I reviewed his ECG which was nonspecific, troponin was normal, chest x-ray showed no acute findings.  He has not undergone any interval ischemic testing since his CABG in 2009, reaching his four-year anniversary next month. Today we discussed his blood pressure, lipid status, also plan for establishing more regular followup and ischemic testing.   No Known Allergies  Past Medical History  Diagnosis Date  . 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 Essential hypertension, benign   . Mixed hyperlipidemia   . Diverticulosis   . Ankle fracture, left     Past Surgical History  Procedure Date  . 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 Donata Clay - LIMA to LAD, SVG to ramus, SVG to RCA, SVG to OM1 and OM 2  . Tonsillectomy   . Adenoidectomy     Family History  Problem Relation Age of Onset  . Coronary artery disease Father     Social History Mr. Ruhlman reports that he has never smoked. He has never used smokeless tobacco. Mr. Mandala reports that he does not drink alcohol.  Review of Systems No true sense of palpitations. No syncope. Reports stable appetite. States that he salts his foods. No orthopnea or PND. No lower extremity edema. No claudication.  Otherwise reviewed and negative.  Physical Examination Filed Vitals:   05/07/11 0849  BP: 176/90  Pulse: 88  Resp: 16   Normally nourished appearing male in no acute distress. HEENT: Conjunctiva and lids normal, oropharynx clear. Neck: Supple, no elevated JVP or carotid bruits, no thyromegaly. Lungs: Clear to auscultation, nonlabored breathing at rest. Cardiac: Regular rate and rhythm, no S3 or significant systolic murmur, no pericardial rub. Abdomen: Soft, nontender, bowel sounds present, no guarding or rebound. Extremities: No pitting edema, distal pulses 2+. Skin: Warm and dry. Musculoskeletal: No kyphosis. Neuropsychiatric: Alert and oriented x3, affect grossly appropriate.    Problem List and Plan

## 2011-05-10 ENCOUNTER — Other Ambulatory Visit: Payer: Self-pay | Admitting: Cardiology

## 2011-05-10 DIAGNOSIS — I2510 Atherosclerotic heart disease of native coronary artery without angina pectoris: Secondary | ICD-10-CM

## 2011-05-10 DIAGNOSIS — R072 Precordial pain: Secondary | ICD-10-CM

## 2011-05-17 ENCOUNTER — Telehealth: Payer: Self-pay | Admitting: Cardiology

## 2011-05-17 DIAGNOSIS — R072 Precordial pain: Secondary | ICD-10-CM

## 2011-05-17 NOTE — Telephone Encounter (Signed)
Please refer to my recent office note. Patient just reestablished, not on any regular cardiac medications for quite some time. In the past he was on both Lopressor 25 mg twice daily, and lisinopril 10 mg daily. Please get a record of his heart rate and blood pressures at home, and if he is willing, we can certainly resume therapy. We did discuss this at the visit. He can have a stress test done at either facility. It should be done in the near future.

## 2011-05-17 NOTE — Telephone Encounter (Addendum)
**Note De-Identified Jouri Threat Obfuscation** Pt. was to have a myoview at [place] office this morning but he and his wife states that he could not do b/c of his weakness and not feeling well today. Mrs. Tomich states that they are going to reschedule when he feels better and would like to have done at [facility]. Mrs. Kelly is advised that, since Mr. Perrell lives in [place] and is a [place] pt., we scheduled test at [place] office for his convenience. She states it does not matter where test is done at but pt. will continue to f/u in [place] office. She is concerned that pt's BP is increasing and wants to know if he needs to be on a BP med. Please advise./LV

## 2011-05-17 NOTE — Telephone Encounter (Signed)
PT WANTS TO KNOW IF HE SHOULD BE ON BP MEDICATION. HE WAS UNABLE TO DO STRESS TEST WAS JUST NOT UP TO DOING IT. WIFE STATES THATV HIS BP HAS BEEN CREEPING UP/TMJ

## 2011-05-18 NOTE — Telephone Encounter (Signed)
**Note De-Identified Alma Mohiuddin Obfuscation** Pt. Is advised to arrive at Radiology Dept. at [facility] at 9:15 on 05-21-11 for myoview. He verbalized understanding./LV

## 2011-05-21 ENCOUNTER — Ambulatory Visit (INDEPENDENT_AMBULATORY_CARE_PROVIDER_SITE_OTHER): Payer: Medicare Other

## 2011-05-21 ENCOUNTER — Encounter (HOSPITAL_COMMUNITY)
Admission: RE | Admit: 2011-05-21 | Discharge: 2011-05-21 | Disposition: A | Discharge status: Home / Self Care | Payer: Medicare Other | Source: Ambulatory Visit | Attending: Cardiology | Admitting: Cardiology

## 2011-05-21 ENCOUNTER — Encounter (HOSPITAL_COMMUNITY): Payer: Self-pay

## 2011-05-21 DIAGNOSIS — R072 Precordial pain: Secondary | ICD-10-CM

## 2011-05-21 DIAGNOSIS — I2510 Atherosclerotic heart disease of native coronary artery without angina pectoris: Secondary | ICD-10-CM

## 2011-05-21 DIAGNOSIS — I10 Essential (primary) hypertension: Secondary | ICD-10-CM | POA: Insufficient documentation

## 2011-05-21 DIAGNOSIS — E785 Hyperlipidemia, unspecified: Secondary | ICD-10-CM | POA: Insufficient documentation

## 2011-05-21 DIAGNOSIS — R079 Chest pain, unspecified: Secondary | ICD-10-CM | POA: Insufficient documentation

## 2011-05-21 DIAGNOSIS — Z951 Presence of aortocoronary bypass graft: Secondary | ICD-10-CM | POA: Insufficient documentation

## 2011-05-21 MED ORDER — TECHNETIUM TC 99M TETROFOSMIN IV KIT
30.0000 | PACK | Freq: Once | INTRAVENOUS | Status: AC | PRN
  Administered 2011-05-21: 32 via INTRAVENOUS

## 2011-05-21 MED ORDER — TECHNETIUM TC 99M TETROFOSMIN IV KIT
10.0000 | PACK | Freq: Once | INTRAVENOUS | Status: AC | PRN
  Administered 2011-05-21: 9.5 via INTRAVENOUS

## 2011-05-21 NOTE — Progress Notes (Signed)
Stress Lab Nurses Notes - Fernando [name]  Fernando [name] 05/21/2011  Reason for doing test: CAD and Chest Pain Type of test: Stress Myoview Nurse performing test: Parke Poisson, RN Nuclear Medicine Tech: Lou Cal Echo Tech: Not Applicable MD performing test: Ival Bible & Joni Reining NP Family MD:  NPCP Test explained and consent signed: yes IV started: 22g jelco, Saline lock flushed, No redness or edema and Saline lock started in radiology Symptoms: fatigue Treatment/Intervention: None Reason test stopped: fatigue and reached target HR After recovery IV was: Discontinued via X-ray tech and No redness or edema Patient to return to Nuc. Med at : 12:40 Patient discharged: Home Patient's Condition upon discharge was: stable Comments: During test peak BP 198/72 & HR 146.  Recovery BP 162/78 & HR 92.  Symptoms resolved in recovery. Erskine Speed T

## 2011-06-02 ENCOUNTER — Telehealth: Payer: Self-pay | Admitting: Cardiology

## 2011-06-02 NOTE — Telephone Encounter (Signed)
Results of myoview/tg °

## 2011-06-02 NOTE — Telephone Encounter (Signed)
**Note De-Identified Jeneen Doutt Obfuscation** Mary, pt's wife advised of myoview results, she verbalized understanding./LV

## 2011-11-23 ENCOUNTER — Encounter: Payer: Self-pay | Admitting: Internal Medicine

## 2012-03-22 DIAGNOSIS — G001 Pneumococcal meningitis: Secondary | ICD-10-CM

## 2012-03-22 HISTORY — DX: Pneumococcal meningitis: G00.1

## 2012-07-20 ENCOUNTER — Encounter: Payer: Self-pay | Admitting: Internal Medicine

## 2012-10-27 IMAGING — CR DG CHEST 2V
2 series · 2 of 2 positions shown · non-contrast
Comparison: 06/22/2007; 05/25/2007; 05/24/2007

CLINICAL DATA: Right-sided chest pain

CHEST - 2 VIEW

[view not recorded (1 of 2)]
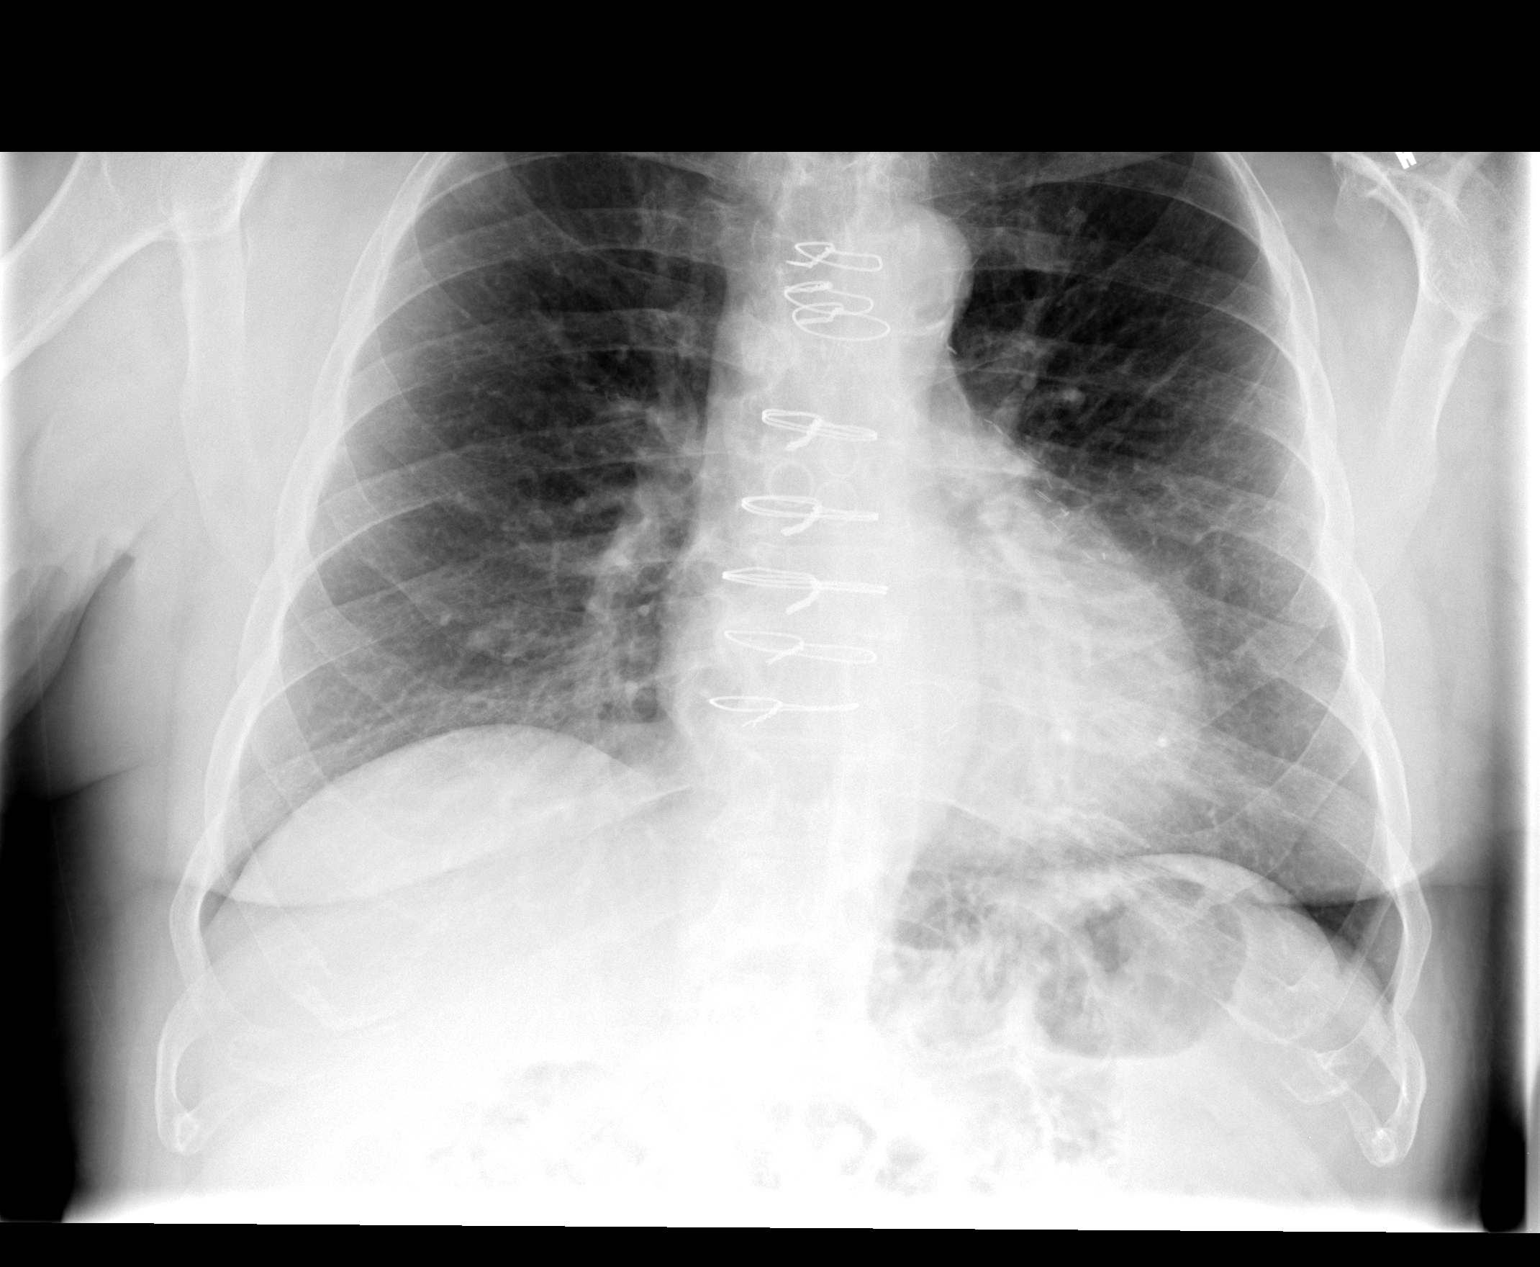

[view not recorded (2 of 2)]
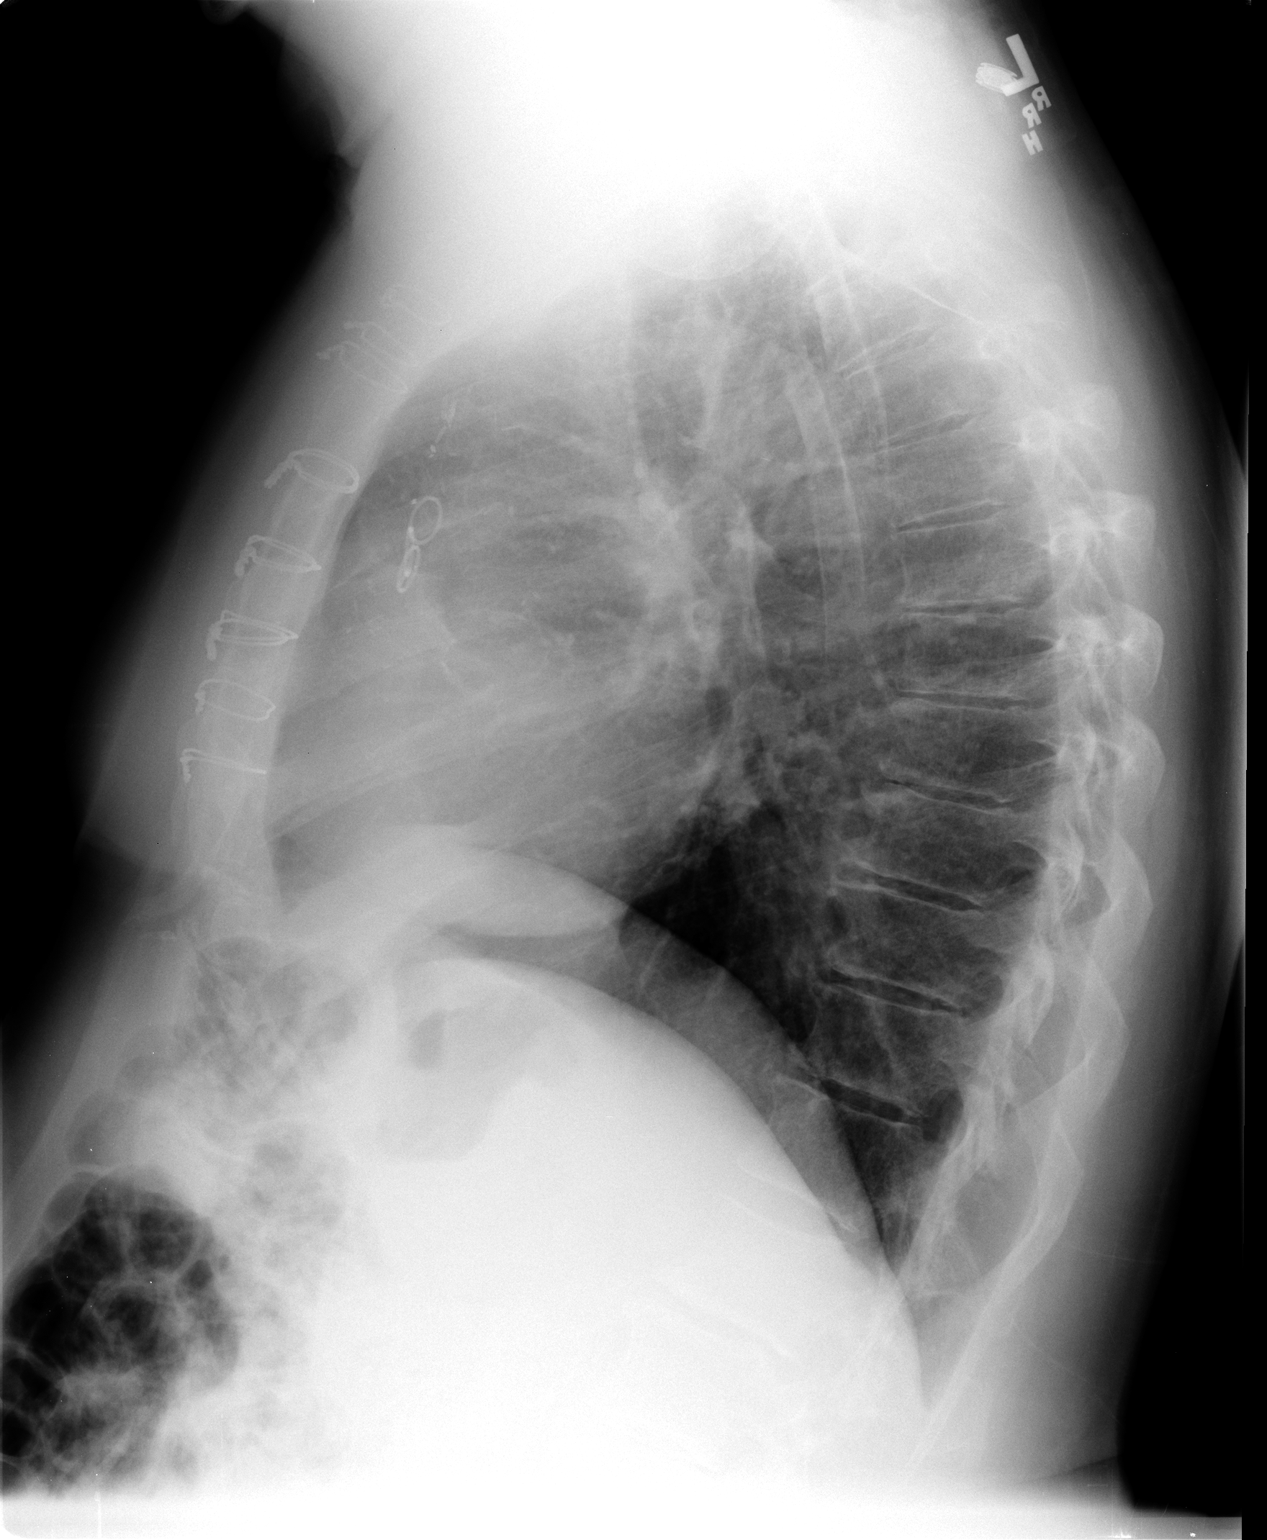

[2 of 2 positions shown; findings below may reference images not displayed]

FINDINGS: Grossly unchanged borderline enlarged cardiac silhouette
with persistent mild tortuosity of the descending thoracic aorta.
Post median sternotomy and CABG.  There is unchanged mild diffuse
increased conspicuity of the pulmonary interstitium without new
focal airspace opacities.  There is persistent mild elevation of
the right hemidiaphragm.  No pleural effusion or pneumothorax.
Unchanged bones.
IMPRESSION: No acute cardiopulmonary disease.

## 2012-11-11 IMAGING — NM NM MYOCAR SINGLE W/SPECT W/WALL MOTION & EF
2 series · 12 of 12 positions shown · non-contrast
Comparison: none

Ordering Physician: CARM PARISI

Maedhbh Physician: [REDACTED]al Data: 70-year-old male with history of multivessel CAD
status post CABG, hypertension, hyperlipidemia, referred for the
assessment of ischemia in light of chest pain symptoms.
NUCLEAR MEDICINE STRESS MYOVIEW STUDY WITH SPECT AND LEFT
VENTRICULAR EJECTION FRACTION
Radionuclide Data: One-day rest/stress protocol performed with
[DATE] mCi of 3c-CCm Myoview.
Stress Data: The patient was exercised on a Bruce protocol for 5
minutes 26 seconds achieving a maximum workload of seven METS.
Heart rate increased from 89 beats per minute up to 146 beats per
minute which was 97% of the maximum age predicted heart rate
response.  Blood pressure increased from 182/80 up to 208/72.  No
angina reported.  No diagnostic ST-segment changes noted, no
arrhythmias.
EKG: Baseline ECG shows normal sinus rhythm at 75, decreased R-wave
progression.
Scintigraphic Data: Analysis of the raw perfusion data shows soft
tissue attenuation from the chest wall.
Tomographic views obtained using the short axis, vertical long
axis, and horizontal long axis planes.  No significant perfusion
defects are noted at stress.
Gated imaging reveals an EDV of 86, ESV of 29, T I D ratio of 0.93,
and LVEF of 67% without wall motion abnormality.

[Series 1: cr cardiac tc low dose · 6.41mm/px · 6 of 64 frames shown]
[frame 6/64]
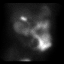
[frame 16/64]
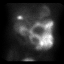
[frame 27/64]
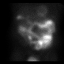
[frame 38/64]
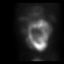
[frame 48/64]
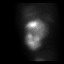
[frame 59/64]
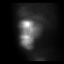

[Series 1: cardiac rest stress · 6.39mm/px · 6 of 512 frames shown]
[frame 43/512]
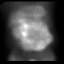
[frame 128/512]
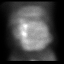
[frame 214/512]
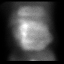
[frame 299/512]
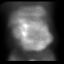
[frame 384/512]
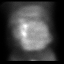
[frame 470/512]
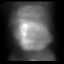

[12 of 12 positions shown; findings below may reference images not displayed]

IMPRESSION: Low risk exercise Myoview.  No diagnostic ST-segment changes were
noted.  Significant hypertensive response observed.  No
arrhythmias.  No significant perfusion defects to indicate
ischemia.  LVEF is normal 67%.

## 2015-04-21 DIAGNOSIS — R39198 Other difficulties with micturition: Secondary | ICD-10-CM | POA: Diagnosis not present

## 2015-04-21 DIAGNOSIS — R3915 Urgency of urination: Secondary | ICD-10-CM | POA: Diagnosis not present

## 2015-04-21 DIAGNOSIS — R319 Hematuria, unspecified: Secondary | ICD-10-CM | POA: Diagnosis not present

## 2015-04-22 DIAGNOSIS — R319 Hematuria, unspecified: Secondary | ICD-10-CM | POA: Diagnosis not present

## 2015-04-22 DIAGNOSIS — R3915 Urgency of urination: Secondary | ICD-10-CM | POA: Diagnosis not present

## 2015-05-20 DIAGNOSIS — R3915 Urgency of urination: Secondary | ICD-10-CM | POA: Diagnosis not present

## 2015-05-20 DIAGNOSIS — R39198 Other difficulties with micturition: Secondary | ICD-10-CM | POA: Diagnosis not present

## 2015-05-20 DIAGNOSIS — R319 Hematuria, unspecified: Secondary | ICD-10-CM | POA: Diagnosis not present

## 2015-06-17 DIAGNOSIS — R319 Hematuria, unspecified: Secondary | ICD-10-CM | POA: Diagnosis not present

## 2015-06-17 DIAGNOSIS — R3915 Urgency of urination: Secondary | ICD-10-CM | POA: Diagnosis not present

## 2015-06-17 DIAGNOSIS — R39198 Other difficulties with micturition: Secondary | ICD-10-CM | POA: Diagnosis not present

## 2015-06-26 DIAGNOSIS — R3129 Other microscopic hematuria: Secondary | ICD-10-CM | POA: Diagnosis not present

## 2015-06-26 DIAGNOSIS — I517 Cardiomegaly: Secondary | ICD-10-CM | POA: Diagnosis not present

## 2015-06-26 DIAGNOSIS — K8080 Other cholelithiasis without obstruction: Secondary | ICD-10-CM | POA: Diagnosis not present

## 2015-06-26 DIAGNOSIS — M47816 Spondylosis without myelopathy or radiculopathy, lumbar region: Secondary | ICD-10-CM | POA: Diagnosis not present

## 2015-06-26 DIAGNOSIS — R935 Abnormal findings on diagnostic imaging of other abdominal regions, including retroperitoneum: Secondary | ICD-10-CM | POA: Diagnosis not present

## 2015-06-26 DIAGNOSIS — M5136 Other intervertebral disc degeneration, lumbar region: Secondary | ICD-10-CM | POA: Diagnosis not present

## 2015-06-26 DIAGNOSIS — I700 Atherosclerosis of aorta: Secondary | ICD-10-CM | POA: Diagnosis not present

## 2015-06-26 DIAGNOSIS — K5730 Diverticulosis of large intestine without perforation or abscess without bleeding: Secondary | ICD-10-CM | POA: Diagnosis not present

## 2015-06-26 DIAGNOSIS — R319 Hematuria, unspecified: Secondary | ICD-10-CM | POA: Diagnosis not present

## 2015-07-29 DIAGNOSIS — R3915 Urgency of urination: Secondary | ICD-10-CM | POA: Diagnosis not present

## 2015-07-29 DIAGNOSIS — R319 Hematuria, unspecified: Secondary | ICD-10-CM | POA: Diagnosis not present

## 2015-08-07 ENCOUNTER — Ambulatory Visit (AMBULATORY_SURGERY_CENTER): Payer: Self-pay | Admitting: *Deleted

## 2015-08-07 VITALS — Ht 70.0 in | Wt 184.8 lb

## 2015-08-07 DIAGNOSIS — Z1211 Encounter for screening for malignant neoplasm of colon: Secondary | ICD-10-CM

## 2015-08-07 MED ORDER — NA SULFATE-K SULFATE-MG SULF 17.5-3.13-1.6 GM/177ML PO SOLN: 1.0000 | Freq: Once | ORAL | Status: DC

## 2015-08-07 NOTE — Progress Notes (Signed)
No egg or soy allergy known to patient  No issues with past sedation with any surgeries  or procedures, no intubation problems  No diet pills per patient No home 02 use per patient  No blood thinners per patient  Pt denies issues with constipation   

## 2015-08-13 ENCOUNTER — Ambulatory Visit (AMBULATORY_SURGERY_CENTER): Payer: Medicare Other | Admitting: Internal Medicine

## 2015-08-13 ENCOUNTER — Encounter: Payer: Self-pay | Admitting: Internal Medicine

## 2015-08-13 VITALS — BP 153/72 | HR 67 | Temp 99.3°F | Resp 11 | Ht 70.0 in | Wt 184.0 lb

## 2015-08-13 DIAGNOSIS — I10 Essential (primary) hypertension: Secondary | ICD-10-CM | POA: Diagnosis not present

## 2015-08-13 DIAGNOSIS — D123 Benign neoplasm of transverse colon: Secondary | ICD-10-CM | POA: Diagnosis not present

## 2015-08-13 DIAGNOSIS — Z1211 Encounter for screening for malignant neoplasm of colon: Secondary | ICD-10-CM | POA: Diagnosis present

## 2015-08-13 DIAGNOSIS — I2510 Atherosclerotic heart disease of native coronary artery without angina pectoris: Secondary | ICD-10-CM | POA: Diagnosis not present

## 2015-08-13 MED ORDER — SODIUM CHLORIDE 0.9 % IV SOLN: 500.0000 mL | INTRAVENOUS | Status: DC

## 2015-08-13 NOTE — Patient Instructions (Signed)
Discharge instructions given. Handouts on polyps,diverticulosis and hemorrhoids. Resume previous medications. YOU HAD AN ENDOSCOPIC PROCEDURE TODAY AT THE [facility]:   Refer to the procedure report that was given to you for any specific questions about what was found during the examination.  If the procedure report does not answer your questions, please call your gastroenterologist to clarify.  If you requested that your care partner not be given the details of your procedure findings, then the procedure report has been included in a sealed envelope for you to review at your convenience later.  YOU SHOULD EXPECT: Some feelings of bloating in the abdomen. Passage of more gas than usual.  Walking can help get rid of the air that was put into your GI tract during the procedure and reduce the bloating. If you had a lower endoscopy (such as a colonoscopy or flexible sigmoidoscopy) you may notice spotting of blood in your stool or on the toilet paper. If you underwent a bowel prep for your procedure, you may not have a normal bowel movement for a few days.  Please Note:  You might notice some irritation and congestion in your nose or some drainage.  This is from the oxygen used during your procedure.  There is no need for concern and it should clear up in a day or so.  SYMPTOMS TO REPORT IMMEDIATELY:   Following lower endoscopy (colonoscopy or flexible sigmoidoscopy):  Excessive amounts of blood in the stool  Significant tenderness or worsening of abdominal pains  Swelling of the abdomen that is new, acute  Fever of 100F or higher   For urgent or emergent issues, a gastroenterologist can be reached at any hour by calling [phone].   DIET: Your first meal following the procedure should be a small meal and then it is ok to progress to your normal diet. Heavy or fried foods are harder to digest and may make you feel nauseous or bloated.  Likewise, meals heavy in dairy and  vegetables can increase bloating.  Drink plenty of fluids but you should avoid alcoholic beverages for 24 hours.  ACTIVITY:  You should plan to take it easy for the rest of today and you should NOT DRIVE or use heavy machinery until tomorrow (because of the sedation medicines used during the test).    FOLLOW UP: Our staff will call the number listed on your records the next business day following your procedure to check on you and address any questions or concerns that you may have regarding the information given to you following your procedure. If we do not reach you, we will leave a message.  However, if you are feeling well and you are not experiencing any problems, there is no need to return our call.  We will assume that you have returned to your regular daily activities without incident.  If any biopsies were taken you will be contacted by phone or by letter within the next 1-3 weeks.  Please call us at [phone] if you have not heard about the biopsies in 3 weeks.    SIGNATURES/CONFIDENTIALITY: You and/or your care partner have signed paperwork which will be entered into your electronic medical record.  These signatures attest to the fact that that the information above on your After Visit Summary has been reviewed and is understood.  Full responsibility of the confidentiality of this discharge information lies with you and/or your care-partner. 

## 2015-08-13 NOTE — Op Note (Signed)
[facility] Patient Name: Fernando [name] Procedure Date: 08/13/2015 2:23 PM MRN: [id number] Endoscopist: Docia Chuck. Henrene Pastor , MD Age: 75 Referring MD:  Date of Birth: September 11, 1940 Gender: Male Procedure:                Colonoscopy, with snare polypectomy X 1 Indications:              Screening for colorectal malignant neoplasm. Last                            examination with Dr. Velora Heckler 2006 was negative for                            neoplasia Medicines:                Monitored Anesthesia Care Procedure:                Pre-Anesthesia Assessment:                           - Prior to the procedure, a History and Physical                            was performed, and patient medications and                            allergies were reviewed. The patient's tolerance of                            previous anesthesia was also reviewed. The risks                            and benefits of the procedure and the sedation                            options and risks were discussed with the patient.                            All questions were answered, and informed consent                            was obtained. Prior Anticoagulants: The patient has                            taken no previous anticoagulant or antiplatelet                            agents. ASA Grade Assessment: III - A patient with                            mild systemic disease. After reviewing the risks                            and benefits, the patient was deemed in  satisfactory condition to undergo the procedure.                           After obtaining informed consent, the colonoscope                            was passed under direct vision. Throughout the                            procedure, the patient's blood pressure, pulse, and                            oxygen saturations were monitored continuously. The                            Model CF-HQ190L [id number]) scope was  introduced                            through the anus and advanced to the the cecum,                            identified by appendiceal orifice and ileocecal                            valve. The ileocecal valve, appendiceal orifice,                            and rectum were photographed. The quality of the                            bowel preparation was excellent. The colonoscopy                            was performed without difficulty. The patient                            tolerated the procedure well. The bowel preparation                            used was SUPREP. Scope In: 2:35:49 PM Scope Out: 2:47:37 PM Scope Withdrawal Time: 0 hours 9 minutes 38 seconds  Total Procedure Duration: 0 hours 11 minutes 48 seconds  Findings:                 A 5 mm polyp was found in the mid transverse colon.                            The polyp was removed with a cold snare. Resection                            and retrieval were complete.                           A few small-mouthed diverticula were found in the  sigmoid colon.                           The exam was otherwise without abnormality on                            direct and retroflexion views.Small internal                            hemorrhoids present Complications:            No immediate complications. Estimated blood loss:                            None. Estimated Blood Loss:     Estimated blood loss: none. Impression:               - One 5 mm polyp in the mid transverse colon,                            removed with a cold snare. Resected and retrieved.                           - Diverticulosis in the sigmoid colon.                           - The examination was otherwise normal on direct                            and retroflexion views. Recommendation:           - Repeat colonoscopy is not recommended for                            surveillance, due to age.                           -  Patient has a contact number available for                            emergencies. The signs and symptoms of potential                            delayed complications were discussed with the                            patient. Return to normal activities tomorrow.                            Written discharge instructions were provided to the                            patient.                           - Resume previous diet.                           -  Continue present medications.                           - Await pathology results. Docia Chuck. Henrene Pastor, MD 08/13/2015 2:53:56 PM This report has been signed electronically. CC Letter to:             Dhruv B. Woody Seller

## 2015-08-13 NOTE — Progress Notes (Signed)
To pacu vss patent aw report to rn 

## 2015-08-13 NOTE — Progress Notes (Signed)
Called to room to assist during endoscopic procedure.  Patient ID and intended procedure confirmed with present staff. Received instructions for my participation in the procedure from the performing physician.  

## 2015-08-14 ENCOUNTER — Telehealth: Payer: Self-pay | Admitting: *Deleted

## 2015-08-14 NOTE — Telephone Encounter (Signed)
  Follow up Call-  Call back number 08/13/2015  Post procedure Call Back phone  # [phone]  Permission to leave phone message Yes   spoke with wife  Patient questions:  Do you have a fever, pain , or abdominal swelling? No. Pain Score  0 *  Have you tolerated food without any problems? Yes.    Have you been able to return to your normal activities? Yes.    Do you have any questions about your discharge instructions: Diet   No. Medications  No. Follow up visit  No.  Do you have questions or concerns about your Care? No.  Actions: * If pain score is 4 or above: No action needed, pain <4.

## 2015-08-19 ENCOUNTER — Encounter: Payer: Self-pay | Admitting: Internal Medicine

## 2015-09-25 DIAGNOSIS — Z299 Encounter for prophylactic measures, unspecified: Secondary | ICD-10-CM | POA: Diagnosis not present

## 2015-09-25 DIAGNOSIS — Z6828 Body mass index (BMI) 28.0-28.9, adult: Secondary | ICD-10-CM | POA: Diagnosis not present

## 2015-09-25 DIAGNOSIS — Z0000 Encounter for general adult medical examination without abnormal findings: Secondary | ICD-10-CM | POA: Diagnosis not present

## 2015-09-25 DIAGNOSIS — Z1211 Encounter for screening for malignant neoplasm of colon: Secondary | ICD-10-CM | POA: Diagnosis not present

## 2015-09-25 DIAGNOSIS — Z7189 Other specified counseling: Secondary | ICD-10-CM | POA: Diagnosis not present

## 2015-09-25 DIAGNOSIS — Z1389 Encounter for screening for other disorder: Secondary | ICD-10-CM | POA: Diagnosis not present

## 2015-09-30 DIAGNOSIS — R5383 Other fatigue: Secondary | ICD-10-CM | POA: Diagnosis not present

## 2015-09-30 DIAGNOSIS — Z125 Encounter for screening for malignant neoplasm of prostate: Secondary | ICD-10-CM | POA: Diagnosis not present

## 2015-09-30 DIAGNOSIS — Z79899 Other long term (current) drug therapy: Secondary | ICD-10-CM | POA: Diagnosis not present

## 2015-10-03 DIAGNOSIS — R39198 Other difficulties with micturition: Secondary | ICD-10-CM | POA: Diagnosis not present

## 2015-10-03 DIAGNOSIS — R3915 Urgency of urination: Secondary | ICD-10-CM | POA: Diagnosis not present

## 2015-10-03 DIAGNOSIS — R319 Hematuria, unspecified: Secondary | ICD-10-CM | POA: Diagnosis not present

## 2015-12-12 DIAGNOSIS — H6980 Other specified disorders of Eustachian tube, unspecified ear: Secondary | ICD-10-CM | POA: Diagnosis not present

## 2015-12-12 DIAGNOSIS — Z789 Other specified health status: Secondary | ICD-10-CM | POA: Diagnosis not present

## 2015-12-12 DIAGNOSIS — N400 Enlarged prostate without lower urinary tract symptoms: Secondary | ICD-10-CM | POA: Diagnosis not present

## 2016-10-05 DIAGNOSIS — I10 Essential (primary) hypertension: Secondary | ICD-10-CM | POA: Diagnosis not present

## 2016-10-05 DIAGNOSIS — Z0000 Encounter for general adult medical examination without abnormal findings: Secondary | ICD-10-CM | POA: Diagnosis not present

## 2016-10-05 DIAGNOSIS — Z6828 Body mass index (BMI) 28.0-28.9, adult: Secondary | ICD-10-CM | POA: Diagnosis not present

## 2016-10-05 DIAGNOSIS — Z79899 Other long term (current) drug therapy: Secondary | ICD-10-CM | POA: Diagnosis not present

## 2016-10-05 DIAGNOSIS — Z299 Encounter for prophylactic measures, unspecified: Secondary | ICD-10-CM | POA: Diagnosis not present

## 2016-10-05 DIAGNOSIS — R5383 Other fatigue: Secondary | ICD-10-CM | POA: Diagnosis not present

## 2016-10-05 DIAGNOSIS — Z7189 Other specified counseling: Secondary | ICD-10-CM | POA: Diagnosis not present

## 2016-10-05 DIAGNOSIS — Z1389 Encounter for screening for other disorder: Secondary | ICD-10-CM | POA: Diagnosis not present

## 2016-10-05 DIAGNOSIS — N400 Enlarged prostate without lower urinary tract symptoms: Secondary | ICD-10-CM | POA: Diagnosis not present

## 2016-10-05 DIAGNOSIS — R197 Diarrhea, unspecified: Secondary | ICD-10-CM | POA: Diagnosis not present

## 2016-10-05 DIAGNOSIS — Z125 Encounter for screening for malignant neoplasm of prostate: Secondary | ICD-10-CM | POA: Diagnosis not present

## 2016-10-06 DIAGNOSIS — I10 Essential (primary) hypertension: Secondary | ICD-10-CM | POA: Diagnosis not present

## 2016-10-06 DIAGNOSIS — Z79899 Other long term (current) drug therapy: Secondary | ICD-10-CM | POA: Diagnosis not present

## 2016-10-06 DIAGNOSIS — R5383 Other fatigue: Secondary | ICD-10-CM | POA: Diagnosis not present

## 2016-10-06 DIAGNOSIS — Z125 Encounter for screening for malignant neoplasm of prostate: Secondary | ICD-10-CM | POA: Diagnosis not present

## 2016-12-09 DIAGNOSIS — G960 Cerebrospinal fluid leak: Secondary | ICD-10-CM | POA: Diagnosis not present

## 2016-12-09 DIAGNOSIS — I10 Essential (primary) hypertension: Secondary | ICD-10-CM | POA: Diagnosis not present

## 2016-12-09 DIAGNOSIS — I2510 Atherosclerotic heart disease of native coronary artery without angina pectoris: Secondary | ICD-10-CM | POA: Diagnosis not present

## 2016-12-09 DIAGNOSIS — H7091 Unspecified mastoiditis, right ear: Secondary | ICD-10-CM | POA: Diagnosis not present

## 2016-12-09 DIAGNOSIS — I252 Old myocardial infarction: Secondary | ICD-10-CM | POA: Diagnosis not present

## 2016-12-09 DIAGNOSIS — E785 Hyperlipidemia, unspecified: Secondary | ICD-10-CM | POA: Diagnosis not present

## 2017-03-07 DIAGNOSIS — Z789 Other specified health status: Secondary | ICD-10-CM | POA: Diagnosis not present

## 2017-03-07 DIAGNOSIS — Z6829 Body mass index (BMI) 29.0-29.9, adult: Secondary | ICD-10-CM | POA: Diagnosis not present

## 2017-03-07 DIAGNOSIS — M542 Cervicalgia: Secondary | ICD-10-CM | POA: Diagnosis not present

## 2017-03-07 DIAGNOSIS — Z299 Encounter for prophylactic measures, unspecified: Secondary | ICD-10-CM | POA: Diagnosis not present

## 2017-03-07 DIAGNOSIS — I10 Essential (primary) hypertension: Secondary | ICD-10-CM | POA: Diagnosis not present

## 2017-03-07 DIAGNOSIS — Z713 Dietary counseling and surveillance: Secondary | ICD-10-CM | POA: Diagnosis not present

## 2017-03-08 DIAGNOSIS — M47812 Spondylosis without myelopathy or radiculopathy, cervical region: Secondary | ICD-10-CM | POA: Diagnosis not present

## 2017-03-09 DIAGNOSIS — I10 Essential (primary) hypertension: Secondary | ICD-10-CM | POA: Diagnosis not present

## 2017-03-09 DIAGNOSIS — M109 Gout, unspecified: Secondary | ICD-10-CM | POA: Diagnosis not present

## 2017-03-09 DIAGNOSIS — M1990 Unspecified osteoarthritis, unspecified site: Secondary | ICD-10-CM | POA: Diagnosis not present

## 2017-03-09 DIAGNOSIS — M542 Cervicalgia: Secondary | ICD-10-CM | POA: Diagnosis not present

## 2017-03-09 DIAGNOSIS — Z299 Encounter for prophylactic measures, unspecified: Secondary | ICD-10-CM | POA: Diagnosis not present

## 2017-03-09 DIAGNOSIS — Z6829 Body mass index (BMI) 29.0-29.9, adult: Secondary | ICD-10-CM | POA: Diagnosis not present

## 2017-03-17 DIAGNOSIS — I10 Essential (primary) hypertension: Secondary | ICD-10-CM | POA: Diagnosis not present

## 2017-03-17 DIAGNOSIS — Z299 Encounter for prophylactic measures, unspecified: Secondary | ICD-10-CM | POA: Diagnosis not present

## 2017-03-17 DIAGNOSIS — M542 Cervicalgia: Secondary | ICD-10-CM | POA: Diagnosis not present

## 2017-03-17 DIAGNOSIS — Z713 Dietary counseling and surveillance: Secondary | ICD-10-CM | POA: Diagnosis not present

## 2017-03-17 DIAGNOSIS — Z6828 Body mass index (BMI) 28.0-28.9, adult: Secondary | ICD-10-CM | POA: Diagnosis not present

## 2017-06-15 DIAGNOSIS — I10 Essential (primary) hypertension: Secondary | ICD-10-CM | POA: Diagnosis not present

## 2017-06-15 DIAGNOSIS — M2550 Pain in unspecified joint: Secondary | ICD-10-CM | POA: Diagnosis not present

## 2017-06-15 DIAGNOSIS — Z6829 Body mass index (BMI) 29.0-29.9, adult: Secondary | ICD-10-CM | POA: Diagnosis not present

## 2017-06-15 DIAGNOSIS — Z299 Encounter for prophylactic measures, unspecified: Secondary | ICD-10-CM | POA: Diagnosis not present

## 2017-06-15 DIAGNOSIS — M542 Cervicalgia: Secondary | ICD-10-CM | POA: Diagnosis not present

## 2017-06-15 DIAGNOSIS — Z2821 Immunization not carried out because of patient refusal: Secondary | ICD-10-CM | POA: Diagnosis not present

## 2017-06-15 DIAGNOSIS — N400 Enlarged prostate without lower urinary tract symptoms: Secondary | ICD-10-CM | POA: Diagnosis not present

## 2017-06-28 DIAGNOSIS — R109 Unspecified abdominal pain: Secondary | ICD-10-CM | POA: Diagnosis not present

## 2017-06-28 DIAGNOSIS — Z6829 Body mass index (BMI) 29.0-29.9, adult: Secondary | ICD-10-CM | POA: Diagnosis not present

## 2017-06-28 DIAGNOSIS — Z299 Encounter for prophylactic measures, unspecified: Secondary | ICD-10-CM | POA: Diagnosis not present

## 2017-06-28 DIAGNOSIS — N400 Enlarged prostate without lower urinary tract symptoms: Secondary | ICD-10-CM | POA: Diagnosis not present

## 2017-06-28 DIAGNOSIS — Z713 Dietary counseling and surveillance: Secondary | ICD-10-CM | POA: Diagnosis not present

## 2017-09-12 DIAGNOSIS — I10 Essential (primary) hypertension: Secondary | ICD-10-CM | POA: Diagnosis not present

## 2017-09-12 DIAGNOSIS — Z6828 Body mass index (BMI) 28.0-28.9, adult: Secondary | ICD-10-CM | POA: Diagnosis not present

## 2017-09-12 DIAGNOSIS — Z299 Encounter for prophylactic measures, unspecified: Secondary | ICD-10-CM | POA: Diagnosis not present

## 2017-09-12 DIAGNOSIS — Z713 Dietary counseling and surveillance: Secondary | ICD-10-CM | POA: Diagnosis not present

## 2017-09-12 DIAGNOSIS — S46219A Strain of muscle, fascia and tendon of other parts of biceps, unspecified arm, initial encounter: Secondary | ICD-10-CM | POA: Diagnosis not present

## 2017-10-12 DIAGNOSIS — Z1331 Encounter for screening for depression: Secondary | ICD-10-CM | POA: Diagnosis not present

## 2017-10-12 DIAGNOSIS — Z1339 Encounter for screening examination for other mental health and behavioral disorders: Secondary | ICD-10-CM | POA: Diagnosis not present

## 2017-10-12 DIAGNOSIS — I10 Essential (primary) hypertension: Secondary | ICD-10-CM | POA: Diagnosis not present

## 2017-10-12 DIAGNOSIS — Z1211 Encounter for screening for malignant neoplasm of colon: Secondary | ICD-10-CM | POA: Diagnosis not present

## 2017-10-12 DIAGNOSIS — Z299 Encounter for prophylactic measures, unspecified: Secondary | ICD-10-CM | POA: Diagnosis not present

## 2017-10-12 DIAGNOSIS — E7800 Pure hypercholesterolemia, unspecified: Secondary | ICD-10-CM | POA: Diagnosis not present

## 2017-10-12 DIAGNOSIS — Z7189 Other specified counseling: Secondary | ICD-10-CM | POA: Diagnosis not present

## 2017-10-12 DIAGNOSIS — R5383 Other fatigue: Secondary | ICD-10-CM | POA: Diagnosis not present

## 2017-10-12 DIAGNOSIS — Z0000 Encounter for general adult medical examination without abnormal findings: Secondary | ICD-10-CM | POA: Diagnosis not present

## 2017-10-12 DIAGNOSIS — Z6828 Body mass index (BMI) 28.0-28.9, adult: Secondary | ICD-10-CM | POA: Diagnosis not present

## 2017-10-13 DIAGNOSIS — Z79899 Other long term (current) drug therapy: Secondary | ICD-10-CM | POA: Diagnosis not present

## 2017-10-13 DIAGNOSIS — E7800 Pure hypercholesterolemia, unspecified: Secondary | ICD-10-CM | POA: Diagnosis not present

## 2017-10-13 DIAGNOSIS — Z125 Encounter for screening for malignant neoplasm of prostate: Secondary | ICD-10-CM | POA: Diagnosis not present

## 2017-10-13 DIAGNOSIS — R5383 Other fatigue: Secondary | ICD-10-CM | POA: Diagnosis not present

## 2017-10-31 DIAGNOSIS — R109 Unspecified abdominal pain: Secondary | ICD-10-CM | POA: Diagnosis not present

## 2017-10-31 DIAGNOSIS — Z299 Encounter for prophylactic measures, unspecified: Secondary | ICD-10-CM | POA: Diagnosis not present

## 2017-10-31 DIAGNOSIS — I10 Essential (primary) hypertension: Secondary | ICD-10-CM | POA: Diagnosis not present

## 2017-10-31 DIAGNOSIS — Z6829 Body mass index (BMI) 29.0-29.9, adult: Secondary | ICD-10-CM | POA: Diagnosis not present

## 2017-10-31 DIAGNOSIS — Z713 Dietary counseling and surveillance: Secondary | ICD-10-CM | POA: Diagnosis not present

## 2017-11-01 DIAGNOSIS — K8020 Calculus of gallbladder without cholecystitis without obstruction: Secondary | ICD-10-CM | POA: Diagnosis not present

## 2017-11-01 DIAGNOSIS — K829 Disease of gallbladder, unspecified: Secondary | ICD-10-CM | POA: Diagnosis not present

## 2018-09-01 DIAGNOSIS — R109 Unspecified abdominal pain: Secondary | ICD-10-CM | POA: Diagnosis not present

## 2018-09-01 DIAGNOSIS — Z299 Encounter for prophylactic measures, unspecified: Secondary | ICD-10-CM | POA: Diagnosis not present

## 2018-09-01 DIAGNOSIS — I10 Essential (primary) hypertension: Secondary | ICD-10-CM | POA: Diagnosis not present

## 2018-09-01 DIAGNOSIS — M25562 Pain in left knee: Secondary | ICD-10-CM | POA: Diagnosis not present

## 2018-09-01 DIAGNOSIS — Z789 Other specified health status: Secondary | ICD-10-CM | POA: Diagnosis not present

## 2018-09-01 DIAGNOSIS — Z6827 Body mass index (BMI) 27.0-27.9, adult: Secondary | ICD-10-CM | POA: Diagnosis not present

## 2018-10-18 DIAGNOSIS — Z1211 Encounter for screening for malignant neoplasm of colon: Secondary | ICD-10-CM | POA: Diagnosis not present

## 2018-10-18 DIAGNOSIS — Z7189 Other specified counseling: Secondary | ICD-10-CM | POA: Diagnosis not present

## 2018-10-18 DIAGNOSIS — E7800 Pure hypercholesterolemia, unspecified: Secondary | ICD-10-CM | POA: Diagnosis not present

## 2018-10-18 DIAGNOSIS — Z1339 Encounter for screening examination for other mental health and behavioral disorders: Secondary | ICD-10-CM | POA: Diagnosis not present

## 2018-10-18 DIAGNOSIS — Z299 Encounter for prophylactic measures, unspecified: Secondary | ICD-10-CM | POA: Diagnosis not present

## 2018-10-18 DIAGNOSIS — Z1331 Encounter for screening for depression: Secondary | ICD-10-CM | POA: Diagnosis not present

## 2018-10-18 DIAGNOSIS — Z6827 Body mass index (BMI) 27.0-27.9, adult: Secondary | ICD-10-CM | POA: Diagnosis not present

## 2018-10-18 DIAGNOSIS — I10 Essential (primary) hypertension: Secondary | ICD-10-CM | POA: Diagnosis not present

## 2018-10-18 DIAGNOSIS — R5383 Other fatigue: Secondary | ICD-10-CM | POA: Diagnosis not present

## 2018-10-18 DIAGNOSIS — Z0000 Encounter for general adult medical examination without abnormal findings: Secondary | ICD-10-CM | POA: Diagnosis not present

## 2018-10-20 DIAGNOSIS — R5383 Other fatigue: Secondary | ICD-10-CM | POA: Diagnosis not present

## 2018-10-20 DIAGNOSIS — E559 Vitamin D deficiency, unspecified: Secondary | ICD-10-CM | POA: Diagnosis not present

## 2018-10-20 DIAGNOSIS — E7800 Pure hypercholesterolemia, unspecified: Secondary | ICD-10-CM | POA: Diagnosis not present

## 2018-10-20 DIAGNOSIS — Z79899 Other long term (current) drug therapy: Secondary | ICD-10-CM | POA: Diagnosis not present

## 2018-10-20 DIAGNOSIS — N400 Enlarged prostate without lower urinary tract symptoms: Secondary | ICD-10-CM | POA: Diagnosis not present

## 2018-12-14 DIAGNOSIS — I10 Essential (primary) hypertension: Secondary | ICD-10-CM | POA: Diagnosis not present

## 2018-12-14 DIAGNOSIS — Z6827 Body mass index (BMI) 27.0-27.9, adult: Secondary | ICD-10-CM | POA: Diagnosis not present

## 2018-12-14 DIAGNOSIS — Z299 Encounter for prophylactic measures, unspecified: Secondary | ICD-10-CM | POA: Diagnosis not present

## 2018-12-14 DIAGNOSIS — Z713 Dietary counseling and surveillance: Secondary | ICD-10-CM | POA: Diagnosis not present

## 2018-12-14 DIAGNOSIS — M25561 Pain in right knee: Secondary | ICD-10-CM | POA: Diagnosis not present

## 2019-01-24 DIAGNOSIS — I10 Essential (primary) hypertension: Secondary | ICD-10-CM | POA: Diagnosis not present

## 2019-01-24 DIAGNOSIS — Z713 Dietary counseling and surveillance: Secondary | ICD-10-CM | POA: Diagnosis not present

## 2019-01-24 DIAGNOSIS — Z6827 Body mass index (BMI) 27.0-27.9, adult: Secondary | ICD-10-CM | POA: Diagnosis not present

## 2019-01-24 DIAGNOSIS — Z299 Encounter for prophylactic measures, unspecified: Secondary | ICD-10-CM | POA: Diagnosis not present

## 2019-02-09 DIAGNOSIS — M170 Bilateral primary osteoarthritis of knee: Secondary | ICD-10-CM | POA: Diagnosis not present

## 2019-02-09 DIAGNOSIS — M1990 Unspecified osteoarthritis, unspecified site: Secondary | ICD-10-CM | POA: Diagnosis not present

## 2019-02-09 DIAGNOSIS — Z299 Encounter for prophylactic measures, unspecified: Secondary | ICD-10-CM | POA: Diagnosis not present

## 2019-02-09 DIAGNOSIS — M25561 Pain in right knee: Secondary | ICD-10-CM | POA: Diagnosis not present

## 2019-02-09 DIAGNOSIS — Z6827 Body mass index (BMI) 27.0-27.9, adult: Secondary | ICD-10-CM | POA: Diagnosis not present

## 2019-02-09 DIAGNOSIS — I10 Essential (primary) hypertension: Secondary | ICD-10-CM | POA: Diagnosis not present

## 2019-04-20 DIAGNOSIS — Z6828 Body mass index (BMI) 28.0-28.9, adult: Secondary | ICD-10-CM | POA: Diagnosis not present

## 2019-04-20 DIAGNOSIS — M170 Bilateral primary osteoarthritis of knee: Secondary | ICD-10-CM | POA: Diagnosis not present

## 2019-04-20 DIAGNOSIS — Z299 Encounter for prophylactic measures, unspecified: Secondary | ICD-10-CM | POA: Diagnosis not present

## 2019-04-20 DIAGNOSIS — Z789 Other specified health status: Secondary | ICD-10-CM | POA: Diagnosis not present

## 2019-04-20 DIAGNOSIS — I10 Essential (primary) hypertension: Secondary | ICD-10-CM | POA: Diagnosis not present

## 2019-05-14 DIAGNOSIS — M1711 Unilateral primary osteoarthritis, right knee: Secondary | ICD-10-CM | POA: Diagnosis not present

## 2019-05-18 DIAGNOSIS — M1712 Unilateral primary osteoarthritis, left knee: Secondary | ICD-10-CM | POA: Diagnosis not present

## 2019-05-21 DIAGNOSIS — M1711 Unilateral primary osteoarthritis, right knee: Secondary | ICD-10-CM | POA: Diagnosis not present

## 2019-05-24 DIAGNOSIS — M1712 Unilateral primary osteoarthritis, left knee: Secondary | ICD-10-CM | POA: Diagnosis not present

## 2019-05-28 DIAGNOSIS — M1711 Unilateral primary osteoarthritis, right knee: Secondary | ICD-10-CM | POA: Diagnosis not present

## 2019-06-01 DIAGNOSIS — M1712 Unilateral primary osteoarthritis, left knee: Secondary | ICD-10-CM | POA: Diagnosis not present

## 2019-06-04 DIAGNOSIS — M1711 Unilateral primary osteoarthritis, right knee: Secondary | ICD-10-CM | POA: Diagnosis not present

## 2019-06-07 DIAGNOSIS — M1712 Unilateral primary osteoarthritis, left knee: Secondary | ICD-10-CM | POA: Diagnosis not present

## 2019-06-11 DIAGNOSIS — M1711 Unilateral primary osteoarthritis, right knee: Secondary | ICD-10-CM | POA: Diagnosis not present

## 2019-06-15 DIAGNOSIS — M1712 Unilateral primary osteoarthritis, left knee: Secondary | ICD-10-CM | POA: Diagnosis not present

## 2019-06-18 DIAGNOSIS — M1711 Unilateral primary osteoarthritis, right knee: Secondary | ICD-10-CM | POA: Diagnosis not present

## 2019-06-21 DIAGNOSIS — M1712 Unilateral primary osteoarthritis, left knee: Secondary | ICD-10-CM | POA: Diagnosis not present

## 2019-07-13 DIAGNOSIS — M549 Dorsalgia, unspecified: Secondary | ICD-10-CM | POA: Diagnosis not present

## 2019-07-13 DIAGNOSIS — Z299 Encounter for prophylactic measures, unspecified: Secondary | ICD-10-CM | POA: Diagnosis not present

## 2019-07-13 DIAGNOSIS — R600 Localized edema: Secondary | ICD-10-CM | POA: Diagnosis not present

## 2019-07-13 DIAGNOSIS — M19049 Primary osteoarthritis, unspecified hand: Secondary | ICD-10-CM | POA: Diagnosis not present

## 2019-07-13 DIAGNOSIS — I10 Essential (primary) hypertension: Secondary | ICD-10-CM | POA: Diagnosis not present

## 2019-08-01 DIAGNOSIS — I10 Essential (primary) hypertension: Secondary | ICD-10-CM | POA: Diagnosis not present

## 2019-08-01 DIAGNOSIS — Z713 Dietary counseling and surveillance: Secondary | ICD-10-CM | POA: Diagnosis not present

## 2019-08-01 DIAGNOSIS — Z299 Encounter for prophylactic measures, unspecified: Secondary | ICD-10-CM | POA: Diagnosis not present

## 2019-08-01 DIAGNOSIS — R109 Unspecified abdominal pain: Secondary | ICD-10-CM | POA: Diagnosis not present

## 2019-08-01 DIAGNOSIS — Z6828 Body mass index (BMI) 28.0-28.9, adult: Secondary | ICD-10-CM | POA: Diagnosis not present

## 2019-10-02 DIAGNOSIS — H524 Presbyopia: Secondary | ICD-10-CM | POA: Diagnosis not present

## 2019-10-02 DIAGNOSIS — H25813 Combined forms of age-related cataract, bilateral: Secondary | ICD-10-CM | POA: Diagnosis not present

## 2019-10-02 DIAGNOSIS — H52223 Regular astigmatism, bilateral: Secondary | ICD-10-CM | POA: Diagnosis not present

## 2019-10-02 DIAGNOSIS — H5203 Hypermetropia, bilateral: Secondary | ICD-10-CM | POA: Diagnosis not present

## 2019-10-02 DIAGNOSIS — H11153 Pinguecula, bilateral: Secondary | ICD-10-CM | POA: Diagnosis not present

## 2019-10-31 DIAGNOSIS — Z1331 Encounter for screening for depression: Secondary | ICD-10-CM | POA: Diagnosis not present

## 2019-10-31 DIAGNOSIS — I10 Essential (primary) hypertension: Secondary | ICD-10-CM | POA: Diagnosis not present

## 2019-10-31 DIAGNOSIS — Z6827 Body mass index (BMI) 27.0-27.9, adult: Secondary | ICD-10-CM | POA: Diagnosis not present

## 2019-10-31 DIAGNOSIS — E559 Vitamin D deficiency, unspecified: Secondary | ICD-10-CM | POA: Diagnosis not present

## 2019-10-31 DIAGNOSIS — Z7189 Other specified counseling: Secondary | ICD-10-CM | POA: Diagnosis not present

## 2019-10-31 DIAGNOSIS — E7800 Pure hypercholesterolemia, unspecified: Secondary | ICD-10-CM | POA: Diagnosis not present

## 2019-10-31 DIAGNOSIS — Z0000 Encounter for general adult medical examination without abnormal findings: Secondary | ICD-10-CM | POA: Diagnosis not present

## 2019-10-31 DIAGNOSIS — Z299 Encounter for prophylactic measures, unspecified: Secondary | ICD-10-CM | POA: Diagnosis not present

## 2019-10-31 DIAGNOSIS — R5383 Other fatigue: Secondary | ICD-10-CM | POA: Diagnosis not present

## 2019-10-31 DIAGNOSIS — Z1339 Encounter for screening examination for other mental health and behavioral disorders: Secondary | ICD-10-CM | POA: Diagnosis not present

## 2019-11-01 DIAGNOSIS — R5383 Other fatigue: Secondary | ICD-10-CM | POA: Diagnosis not present

## 2019-11-01 DIAGNOSIS — Z125 Encounter for screening for malignant neoplasm of prostate: Secondary | ICD-10-CM | POA: Diagnosis not present

## 2019-11-01 DIAGNOSIS — E7800 Pure hypercholesterolemia, unspecified: Secondary | ICD-10-CM | POA: Diagnosis not present

## 2019-11-01 DIAGNOSIS — E559 Vitamin D deficiency, unspecified: Secondary | ICD-10-CM | POA: Diagnosis not present

## 2019-11-01 DIAGNOSIS — Z79899 Other long term (current) drug therapy: Secondary | ICD-10-CM | POA: Diagnosis not present

## 2020-11-03 DIAGNOSIS — Z6828 Body mass index (BMI) 28.0-28.9, adult: Secondary | ICD-10-CM | POA: Diagnosis not present

## 2020-11-03 DIAGNOSIS — Z1331 Encounter for screening for depression: Secondary | ICD-10-CM | POA: Diagnosis not present

## 2020-11-03 DIAGNOSIS — Z1339 Encounter for screening examination for other mental health and behavioral disorders: Secondary | ICD-10-CM | POA: Diagnosis not present

## 2020-11-03 DIAGNOSIS — B029 Zoster without complications: Secondary | ICD-10-CM | POA: Diagnosis not present

## 2020-11-03 DIAGNOSIS — Z7189 Other specified counseling: Secondary | ICD-10-CM | POA: Diagnosis not present

## 2020-11-03 DIAGNOSIS — Z0000 Encounter for general adult medical examination without abnormal findings: Secondary | ICD-10-CM | POA: Diagnosis not present

## 2020-11-03 DIAGNOSIS — E7800 Pure hypercholesterolemia, unspecified: Secondary | ICD-10-CM | POA: Diagnosis not present

## 2020-11-03 DIAGNOSIS — Z299 Encounter for prophylactic measures, unspecified: Secondary | ICD-10-CM | POA: Diagnosis not present

## 2020-11-03 DIAGNOSIS — R5383 Other fatigue: Secondary | ICD-10-CM | POA: Diagnosis not present

## 2020-11-03 DIAGNOSIS — I10 Essential (primary) hypertension: Secondary | ICD-10-CM | POA: Diagnosis not present

## 2020-11-04 DIAGNOSIS — Z125 Encounter for screening for malignant neoplasm of prostate: Secondary | ICD-10-CM | POA: Diagnosis not present

## 2020-11-04 DIAGNOSIS — R5383 Other fatigue: Secondary | ICD-10-CM | POA: Diagnosis not present

## 2020-11-04 DIAGNOSIS — E7800 Pure hypercholesterolemia, unspecified: Secondary | ICD-10-CM | POA: Diagnosis not present

## 2020-11-04 DIAGNOSIS — Z79899 Other long term (current) drug therapy: Secondary | ICD-10-CM | POA: Diagnosis not present

## 2021-01-21 DIAGNOSIS — M1712 Unilateral primary osteoarthritis, left knee: Secondary | ICD-10-CM | POA: Diagnosis not present

## 2021-01-21 DIAGNOSIS — E559 Vitamin D deficiency, unspecified: Secondary | ICD-10-CM | POA: Diagnosis not present

## 2021-01-21 DIAGNOSIS — R600 Localized edema: Secondary | ICD-10-CM | POA: Diagnosis not present

## 2021-01-21 DIAGNOSIS — J329 Chronic sinusitis, unspecified: Secondary | ICD-10-CM | POA: Diagnosis not present

## 2021-01-21 DIAGNOSIS — Z299 Encounter for prophylactic measures, unspecified: Secondary | ICD-10-CM | POA: Diagnosis not present

## 2021-03-06 DIAGNOSIS — M25562 Pain in left knee: Secondary | ICD-10-CM | POA: Diagnosis not present

## 2021-03-06 DIAGNOSIS — Z6828 Body mass index (BMI) 28.0-28.9, adult: Secondary | ICD-10-CM | POA: Diagnosis not present

## 2021-03-06 DIAGNOSIS — Z299 Encounter for prophylactic measures, unspecified: Secondary | ICD-10-CM | POA: Diagnosis not present

## 2021-03-06 DIAGNOSIS — R109 Unspecified abdominal pain: Secondary | ICD-10-CM | POA: Diagnosis not present

## 2021-03-06 DIAGNOSIS — I10 Essential (primary) hypertension: Secondary | ICD-10-CM | POA: Diagnosis not present

## 2021-03-25 DIAGNOSIS — Z299 Encounter for prophylactic measures, unspecified: Secondary | ICD-10-CM | POA: Diagnosis not present

## 2021-03-25 DIAGNOSIS — I10 Essential (primary) hypertension: Secondary | ICD-10-CM | POA: Diagnosis not present

## 2021-03-25 DIAGNOSIS — M170 Bilateral primary osteoarthritis of knee: Secondary | ICD-10-CM | POA: Diagnosis not present

## 2021-03-25 DIAGNOSIS — Z6828 Body mass index (BMI) 28.0-28.9, adult: Secondary | ICD-10-CM | POA: Diagnosis not present

## 2021-03-25 DIAGNOSIS — M109 Gout, unspecified: Secondary | ICD-10-CM | POA: Diagnosis not present

## 2021-04-08 DIAGNOSIS — M25562 Pain in left knee: Secondary | ICD-10-CM | POA: Diagnosis not present

## 2021-04-08 DIAGNOSIS — M179 Osteoarthritis of knee, unspecified: Secondary | ICD-10-CM | POA: Diagnosis not present

## 2021-04-08 DIAGNOSIS — N400 Enlarged prostate without lower urinary tract symptoms: Secondary | ICD-10-CM | POA: Diagnosis not present

## 2021-04-08 DIAGNOSIS — I10 Essential (primary) hypertension: Secondary | ICD-10-CM | POA: Diagnosis not present

## 2021-04-08 DIAGNOSIS — Z299 Encounter for prophylactic measures, unspecified: Secondary | ICD-10-CM | POA: Diagnosis not present

## 2021-07-02 DIAGNOSIS — M1711 Unilateral primary osteoarthritis, right knee: Secondary | ICD-10-CM | POA: Diagnosis not present

## 2021-07-03 DIAGNOSIS — M1712 Unilateral primary osteoarthritis, left knee: Secondary | ICD-10-CM | POA: Diagnosis not present

## 2021-07-09 DIAGNOSIS — M1711 Unilateral primary osteoarthritis, right knee: Secondary | ICD-10-CM | POA: Diagnosis not present

## 2021-07-10 DIAGNOSIS — M1712 Unilateral primary osteoarthritis, left knee: Secondary | ICD-10-CM | POA: Diagnosis not present

## 2021-07-16 DIAGNOSIS — M1711 Unilateral primary osteoarthritis, right knee: Secondary | ICD-10-CM | POA: Diagnosis not present

## 2021-07-17 DIAGNOSIS — M1712 Unilateral primary osteoarthritis, left knee: Secondary | ICD-10-CM | POA: Diagnosis not present

## 2021-07-23 DIAGNOSIS — M1711 Unilateral primary osteoarthritis, right knee: Secondary | ICD-10-CM | POA: Diagnosis not present

## 2021-07-24 DIAGNOSIS — M1712 Unilateral primary osteoarthritis, left knee: Secondary | ICD-10-CM | POA: Diagnosis not present

## 2021-11-12 DIAGNOSIS — R5383 Other fatigue: Secondary | ICD-10-CM | POA: Diagnosis not present

## 2021-11-12 DIAGNOSIS — Z0000 Encounter for general adult medical examination without abnormal findings: Secondary | ICD-10-CM | POA: Diagnosis not present

## 2021-11-12 DIAGNOSIS — Z6828 Body mass index (BMI) 28.0-28.9, adult: Secondary | ICD-10-CM | POA: Diagnosis not present

## 2021-11-12 DIAGNOSIS — Z1331 Encounter for screening for depression: Secondary | ICD-10-CM | POA: Diagnosis not present

## 2021-11-12 DIAGNOSIS — Z7189 Other specified counseling: Secondary | ICD-10-CM | POA: Diagnosis not present

## 2021-11-12 DIAGNOSIS — E7800 Pure hypercholesterolemia, unspecified: Secondary | ICD-10-CM | POA: Diagnosis not present

## 2021-11-12 DIAGNOSIS — Z79899 Other long term (current) drug therapy: Secondary | ICD-10-CM | POA: Diagnosis not present

## 2021-11-12 DIAGNOSIS — Z1339 Encounter for screening examination for other mental health and behavioral disorders: Secondary | ICD-10-CM | POA: Diagnosis not present

## 2021-11-12 DIAGNOSIS — Z299 Encounter for prophylactic measures, unspecified: Secondary | ICD-10-CM | POA: Diagnosis not present

## 2021-11-13 DIAGNOSIS — Z125 Encounter for screening for malignant neoplasm of prostate: Secondary | ICD-10-CM | POA: Diagnosis not present

## 2021-11-13 DIAGNOSIS — E7800 Pure hypercholesterolemia, unspecified: Secondary | ICD-10-CM | POA: Diagnosis not present

## 2021-11-13 DIAGNOSIS — E559 Vitamin D deficiency, unspecified: Secondary | ICD-10-CM | POA: Diagnosis not present

## 2021-11-13 DIAGNOSIS — R5383 Other fatigue: Secondary | ICD-10-CM | POA: Diagnosis not present

## 2021-11-13 DIAGNOSIS — Z79899 Other long term (current) drug therapy: Secondary | ICD-10-CM | POA: Diagnosis not present

## 2021-12-09 DIAGNOSIS — Z299 Encounter for prophylactic measures, unspecified: Secondary | ICD-10-CM | POA: Diagnosis not present

## 2021-12-09 DIAGNOSIS — K649 Unspecified hemorrhoids: Secondary | ICD-10-CM | POA: Diagnosis not present

## 2021-12-09 DIAGNOSIS — I10 Essential (primary) hypertension: Secondary | ICD-10-CM | POA: Diagnosis not present

## 2021-12-09 DIAGNOSIS — R109 Unspecified abdominal pain: Secondary | ICD-10-CM | POA: Diagnosis not present

## 2022-02-18 DIAGNOSIS — I10 Essential (primary) hypertension: Secondary | ICD-10-CM | POA: Diagnosis not present

## 2022-02-18 DIAGNOSIS — Z299 Encounter for prophylactic measures, unspecified: Secondary | ICD-10-CM | POA: Diagnosis not present

## 2022-02-18 DIAGNOSIS — C44622 Squamous cell carcinoma of skin of right upper limb, including shoulder: Secondary | ICD-10-CM | POA: Diagnosis not present

## 2022-02-18 DIAGNOSIS — D485 Neoplasm of uncertain behavior of skin: Secondary | ICD-10-CM | POA: Diagnosis not present

## 2022-03-03 DIAGNOSIS — I10 Essential (primary) hypertension: Secondary | ICD-10-CM | POA: Diagnosis not present

## 2022-03-03 DIAGNOSIS — Z299 Encounter for prophylactic measures, unspecified: Secondary | ICD-10-CM | POA: Diagnosis not present

## 2022-03-03 DIAGNOSIS — E7800 Pure hypercholesterolemia, unspecified: Secondary | ICD-10-CM | POA: Diagnosis not present

## 2022-03-17 DIAGNOSIS — C44622 Squamous cell carcinoma of skin of right upper limb, including shoulder: Secondary | ICD-10-CM | POA: Diagnosis not present

## 2022-03-19 DIAGNOSIS — C44602 Unspecified malignant neoplasm of skin of right upper limb, including shoulder: Secondary | ICD-10-CM | POA: Diagnosis not present

## 2022-03-19 DIAGNOSIS — C44622 Squamous cell carcinoma of skin of right upper limb, including shoulder: Secondary | ICD-10-CM | POA: Diagnosis not present

## 2022-03-26 DIAGNOSIS — C44622 Squamous cell carcinoma of skin of right upper limb, including shoulder: Secondary | ICD-10-CM | POA: Diagnosis not present

## 2022-03-26 DIAGNOSIS — Z79899 Other long term (current) drug therapy: Secondary | ICD-10-CM | POA: Diagnosis not present

## 2022-03-26 DIAGNOSIS — I2510 Atherosclerotic heart disease of native coronary artery without angina pectoris: Secondary | ICD-10-CM | POA: Diagnosis not present

## 2022-03-31 DIAGNOSIS — C44622 Squamous cell carcinoma of skin of right upper limb, including shoulder: Secondary | ICD-10-CM | POA: Diagnosis not present

## 2022-03-31 DIAGNOSIS — Z09 Encounter for follow-up examination after completed treatment for conditions other than malignant neoplasm: Secondary | ICD-10-CM | POA: Diagnosis not present

## 2022-04-20 DIAGNOSIS — U071 COVID-19: Secondary | ICD-10-CM | POA: Diagnosis not present

## 2022-04-20 DIAGNOSIS — R0981 Nasal congestion: Secondary | ICD-10-CM | POA: Diagnosis not present

## 2022-04-20 DIAGNOSIS — Z299 Encounter for prophylactic measures, unspecified: Secondary | ICD-10-CM | POA: Diagnosis not present

## 2022-05-12 DIAGNOSIS — D225 Melanocytic nevi of trunk: Secondary | ICD-10-CM | POA: Diagnosis not present

## 2022-05-12 DIAGNOSIS — D239 Other benign neoplasm of skin, unspecified: Secondary | ICD-10-CM | POA: Diagnosis not present

## 2022-05-12 DIAGNOSIS — L570 Actinic keratosis: Secondary | ICD-10-CM | POA: Diagnosis not present

## 2022-05-12 DIAGNOSIS — Z85828 Personal history of other malignant neoplasm of skin: Secondary | ICD-10-CM | POA: Diagnosis not present

## 2022-05-12 DIAGNOSIS — L820 Inflamed seborrheic keratosis: Secondary | ICD-10-CM | POA: Diagnosis not present

## 2022-05-12 DIAGNOSIS — Z1283 Encounter for screening for malignant neoplasm of skin: Secondary | ICD-10-CM | POA: Diagnosis not present

## 2022-05-12 DIAGNOSIS — D485 Neoplasm of uncertain behavior of skin: Secondary | ICD-10-CM | POA: Diagnosis not present

## 2022-05-12 DIAGNOSIS — D0439 Carcinoma in situ of skin of other parts of face: Secondary | ICD-10-CM | POA: Diagnosis not present

## 2022-05-20 DIAGNOSIS — C44329 Squamous cell carcinoma of skin of other parts of face: Secondary | ICD-10-CM | POA: Diagnosis not present

## 2022-07-08 DIAGNOSIS — H52223 Regular astigmatism, bilateral: Secondary | ICD-10-CM | POA: Diagnosis not present

## 2022-07-08 DIAGNOSIS — H25813 Combined forms of age-related cataract, bilateral: Secondary | ICD-10-CM | POA: Diagnosis not present

## 2022-07-08 DIAGNOSIS — H11153 Pinguecula, bilateral: Secondary | ICD-10-CM | POA: Diagnosis not present

## 2022-07-08 DIAGNOSIS — H5203 Hypermetropia, bilateral: Secondary | ICD-10-CM | POA: Diagnosis not present

## 2022-07-08 DIAGNOSIS — H524 Presbyopia: Secondary | ICD-10-CM | POA: Diagnosis not present

## 2022-07-08 DIAGNOSIS — H43813 Vitreous degeneration, bilateral: Secondary | ICD-10-CM | POA: Diagnosis not present

## 2022-10-05 DIAGNOSIS — M25562 Pain in left knee: Secondary | ICD-10-CM | POA: Diagnosis not present

## 2022-10-05 DIAGNOSIS — I10 Essential (primary) hypertension: Secondary | ICD-10-CM | POA: Diagnosis not present

## 2022-10-05 DIAGNOSIS — Z299 Encounter for prophylactic measures, unspecified: Secondary | ICD-10-CM | POA: Diagnosis not present

## 2022-11-03 DIAGNOSIS — D485 Neoplasm of uncertain behavior of skin: Secondary | ICD-10-CM | POA: Diagnosis not present

## 2022-11-03 DIAGNOSIS — L570 Actinic keratosis: Secondary | ICD-10-CM | POA: Diagnosis not present

## 2022-11-03 DIAGNOSIS — Z85828 Personal history of other malignant neoplasm of skin: Secondary | ICD-10-CM | POA: Diagnosis not present

## 2022-11-03 DIAGNOSIS — Z1283 Encounter for screening for malignant neoplasm of skin: Secondary | ICD-10-CM | POA: Diagnosis not present

## 2022-11-17 DIAGNOSIS — Z7189 Other specified counseling: Secondary | ICD-10-CM | POA: Diagnosis not present

## 2022-11-17 DIAGNOSIS — Z1339 Encounter for screening examination for other mental health and behavioral disorders: Secondary | ICD-10-CM | POA: Diagnosis not present

## 2022-11-17 DIAGNOSIS — R5383 Other fatigue: Secondary | ICD-10-CM | POA: Diagnosis not present

## 2022-11-17 DIAGNOSIS — Z0000 Encounter for general adult medical examination without abnormal findings: Secondary | ICD-10-CM | POA: Diagnosis not present

## 2022-11-17 DIAGNOSIS — Z299 Encounter for prophylactic measures, unspecified: Secondary | ICD-10-CM | POA: Diagnosis not present

## 2022-11-17 DIAGNOSIS — Z1331 Encounter for screening for depression: Secondary | ICD-10-CM | POA: Diagnosis not present

## 2022-11-17 DIAGNOSIS — Z87891 Personal history of nicotine dependence: Secondary | ICD-10-CM | POA: Diagnosis not present

## 2022-11-17 DIAGNOSIS — Z79899 Other long term (current) drug therapy: Secondary | ICD-10-CM | POA: Diagnosis not present

## 2022-11-17 DIAGNOSIS — E7800 Pure hypercholesterolemia, unspecified: Secondary | ICD-10-CM | POA: Diagnosis not present

## 2022-11-17 DIAGNOSIS — I10 Essential (primary) hypertension: Secondary | ICD-10-CM | POA: Diagnosis not present

## 2022-11-18 DIAGNOSIS — E559 Vitamin D deficiency, unspecified: Secondary | ICD-10-CM | POA: Diagnosis not present

## 2022-11-18 DIAGNOSIS — Z79899 Other long term (current) drug therapy: Secondary | ICD-10-CM | POA: Diagnosis not present

## 2022-11-18 DIAGNOSIS — Z125 Encounter for screening for malignant neoplasm of prostate: Secondary | ICD-10-CM | POA: Diagnosis not present

## 2022-11-18 DIAGNOSIS — R5383 Other fatigue: Secondary | ICD-10-CM | POA: Diagnosis not present

## 2022-11-18 DIAGNOSIS — E7800 Pure hypercholesterolemia, unspecified: Secondary | ICD-10-CM | POA: Diagnosis not present

## 2022-11-26 DIAGNOSIS — M1712 Unilateral primary osteoarthritis, left knee: Secondary | ICD-10-CM | POA: Diagnosis not present

## 2022-11-26 DIAGNOSIS — I10 Essential (primary) hypertension: Secondary | ICD-10-CM | POA: Diagnosis not present

## 2022-11-26 DIAGNOSIS — Z299 Encounter for prophylactic measures, unspecified: Secondary | ICD-10-CM | POA: Diagnosis not present

## 2022-11-30 DIAGNOSIS — M170 Bilateral primary osteoarthritis of knee: Secondary | ICD-10-CM | POA: Diagnosis not present

## 2022-11-30 DIAGNOSIS — M25561 Pain in right knee: Secondary | ICD-10-CM | POA: Diagnosis not present

## 2022-11-30 DIAGNOSIS — M25562 Pain in left knee: Secondary | ICD-10-CM | POA: Diagnosis not present

## 2022-12-06 DIAGNOSIS — R010 Benign and innocent cardiac murmurs: Secondary | ICD-10-CM | POA: Diagnosis not present

## 2022-12-13 DIAGNOSIS — I10 Essential (primary) hypertension: Secondary | ICD-10-CM | POA: Diagnosis not present

## 2022-12-13 DIAGNOSIS — Z2821 Immunization not carried out because of patient refusal: Secondary | ICD-10-CM | POA: Diagnosis not present

## 2022-12-13 DIAGNOSIS — Z299 Encounter for prophylactic measures, unspecified: Secondary | ICD-10-CM | POA: Diagnosis not present

## 2022-12-13 DIAGNOSIS — I272 Pulmonary hypertension, unspecified: Secondary | ICD-10-CM | POA: Diagnosis not present

## 2022-12-13 DIAGNOSIS — I350 Nonrheumatic aortic (valve) stenosis: Secondary | ICD-10-CM | POA: Diagnosis not present

## 2022-12-13 DIAGNOSIS — I2510 Atherosclerotic heart disease of native coronary artery without angina pectoris: Secondary | ICD-10-CM | POA: Diagnosis not present

## 2022-12-13 DIAGNOSIS — I255 Ischemic cardiomyopathy: Secondary | ICD-10-CM | POA: Diagnosis not present

## 2022-12-30 DIAGNOSIS — M1712 Unilateral primary osteoarthritis, left knee: Secondary | ICD-10-CM | POA: Diagnosis not present

## 2022-12-30 DIAGNOSIS — I10 Essential (primary) hypertension: Secondary | ICD-10-CM | POA: Diagnosis not present

## 2022-12-30 DIAGNOSIS — Z299 Encounter for prophylactic measures, unspecified: Secondary | ICD-10-CM | POA: Diagnosis not present

## 2022-12-30 DIAGNOSIS — M1711 Unilateral primary osteoarthritis, right knee: Secondary | ICD-10-CM | POA: Diagnosis not present

## 2023-01-06 DIAGNOSIS — M1711 Unilateral primary osteoarthritis, right knee: Secondary | ICD-10-CM | POA: Diagnosis not present

## 2023-01-07 DIAGNOSIS — M1712 Unilateral primary osteoarthritis, left knee: Secondary | ICD-10-CM | POA: Diagnosis not present

## 2023-01-13 ENCOUNTER — Encounter: Payer: Self-pay | Admitting: Internal Medicine

## 2023-01-13 ENCOUNTER — Ambulatory Visit: Payer: Medicare Other | Attending: Internal Medicine | Admitting: Internal Medicine

## 2023-01-13 VITALS — BP 218/88 | HR 76 | Ht 69.0 in | Wt 181.0 lb

## 2023-01-13 DIAGNOSIS — I361 Nonrheumatic tricuspid (valve) insufficiency: Secondary | ICD-10-CM | POA: Diagnosis not present

## 2023-01-13 DIAGNOSIS — I071 Rheumatic tricuspid insufficiency: Secondary | ICD-10-CM | POA: Insufficient documentation

## 2023-01-13 DIAGNOSIS — I429 Cardiomyopathy, unspecified: Secondary | ICD-10-CM | POA: Insufficient documentation

## 2023-01-13 DIAGNOSIS — I10 Essential (primary) hypertension: Secondary | ICD-10-CM | POA: Diagnosis not present

## 2023-01-13 DIAGNOSIS — I272 Pulmonary hypertension, unspecified: Secondary | ICD-10-CM | POA: Insufficient documentation

## 2023-01-13 DIAGNOSIS — I43 Cardiomyopathy in diseases classified elsewhere: Secondary | ICD-10-CM | POA: Diagnosis not present

## 2023-01-13 DIAGNOSIS — M1711 Unilateral primary osteoarthritis, right knee: Secondary | ICD-10-CM | POA: Diagnosis not present

## 2023-01-13 DIAGNOSIS — I080 Rheumatic disorders of both mitral and aortic valves: Secondary | ICD-10-CM | POA: Diagnosis not present

## 2023-01-13 DIAGNOSIS — I119 Hypertensive heart disease without heart failure: Secondary | ICD-10-CM | POA: Insufficient documentation

## 2023-01-13 MED ORDER — ASPIRIN 81 MG PO TBEC: 81.0000 mg | DELAYED_RELEASE_TABLET | Freq: Every day | ORAL | 2 refills | Status: AC

## 2023-01-13 NOTE — Progress Notes (Signed)
Cardiology Office Note  Date: 01/13/2023   ID: Fernando [name], DOB 1941/03/16, MRN [id number]  PCP:  Ignatius Specking, MD  Cardiologist:  Marjo Bicker, MD Electrophysiologist:  None   History of Present Illness: Fernando [name] is a 82 y.o. male known to have CAD s/p 5 vessel CABG in 2009, HLD, HTN, was referred to cardiology clinic to establish care.  In 2009, he was a 82 year old presented to visit to his PCPs office with 2 to 3 weeks of exertional upper back and shoulder pain, EKG performed at PCPs office showed dramatic ST segment changes due to which she underwent emergent LHC that showed 95% stenosis of the left main with probable thrombus as well as three-vessel CAD. He underwent emergent CABG, LIMA to LAD, SVG to diagonal, sequential SVG to OM1 and OM 2, SVG to RPDA.  LVEF at that time was 45%.  He has been off aspirin and statin since 2009.  He recently went to his PCPs office and underwent echocardiogram that showed LVEF 40 to 45%, valvular heart disease (moderate AAS, mild to moderate AR, moderate TR, moderate MR), moderate pulmonary hypertension.  He denies having any DOE, orthopnea, PND, leg swelling or angina.  He does have some SOB with activities but not overly concerning.  Does not want to take statin due to side effects.    Past Medical History:  Diagnosis Date   Allergy    Ankle fracture, left    Coronary atherosclerosis of native coronary artery    Multivessel   Diverticulosis    Essential hypertension, benign    Heart murmur    Meningitis, pneumococcal 03/22/2012   Mixed hyperlipidemia     Past Surgical History:  Procedure Laterality Date   ADENOIDECTOMY     COLONOSCOPY     CORONARY ARTERY BYPASS GRAFT  March 2009   Dr. Donata Clay - LIMA to LAD, SVG to ramus, SVG to RCA, SVG to OM1 and OM 2   TONSILLECTOMY     transmastoid middle fossa repair     from pneum.meningitis     Current Outpatient Medications  Medication Sig Dispense Refill    Alpha-Lipoic Acid 100 MG CAPS Take 1 capsule by mouth daily.     Amino Acids (L-CARNITINE PO) Take 1 tablet by mouth daily.     aspirin EC 81 MG tablet Take 1 tablet (81 mg total) by mouth daily. Swallow whole. 30 tablet 2   Coenzyme Q10 (CO Q 10) 10 MG CAPS Take 10 mg by mouth daily.     Multiple Vitamin (MULTIVITAMIN) tablet Take 1 tablet by mouth daily.     Omega-3 1000 MG CAPS Take 1 g by mouth daily.     tamsulosin (FLOMAX) 0.4 MG CAPS capsule Take 0.4 mg by mouth daily.     vitamin E 100 UNIT capsule Take 100 Units by mouth daily.     No current facility-administered medications for this visit.   Allergies:  Patient has no known allergies.   Social History: The patient  reports that he has never smoked. He has never used smokeless tobacco. He reports that he does not drink alcohol and does not use drugs.   Family History: The patient's family history includes Colon cancer in his maternal uncle; Colon polyps in his father; Coronary artery disease in his father.   ROS:  Please see the history of present illness. Otherwise, complete review of systems is positive for none  All other systems are reviewed and negative.  Physical Exam: VS:  BP (!) 218/88 (BP Location: Right Arm, Cuff Size: Normal)   Pulse 76   Ht 5\' 9"  (1.753 m)   Wt 181 lb (82.1 kg)   SpO2 97%   BMI 26.73 kg/m , BMI Body mass index is 26.73 kg/m.  Wt Readings from Last 3 Encounters:  01/13/23 181 lb (82.1 kg)  08/13/15 184 lb (83.5 kg)  08/07/15 184 lb 12.8 oz (83.8 kg)    General: Patient appears comfortable at rest. HEENT: Conjunctiva and lids normal, oropharynx clear with moist mucosa. Neck: Supple, no elevated JVP or carotid bruits, no thyromegaly. Lungs: Clear to auscultation, nonlabored breathing at rest. Cardiac: Regular rate and rhythm, no S3 or significant systolic murmur, no pericardial rub. Abdomen: Soft, nontender, no hepatomegaly, bowel sounds present, no guarding or rebound. Extremities: No  pitting edema, distal pulses 2+. Skin: Warm and dry. Musculoskeletal: No kyphosis. Neuropsychiatric: Alert and oriented x3, affect grossly appropriate.  Recent Labwork: No results found for requested labs within last 365 days.  No results found for: "CHOL", "TRIG", "HDL", "CHOLHDL", "VLDL", "LDLCALC", "LDLDIRECT"    Assessment and Plan:  CAD manifested by ACS in 2009 s/p emergent 5v CABG (LIMA to LAD, SVG to diagonal, sequential SVG to OM1 and OM 2, SVG to RPDA) with LVEF 40 to 45% (45% at the time of CABG), currently angina free Valvular heart disease (moderate AS, mild to moderate AR, moderate MR, moderate TR) in 2024 Moderate pulmonary hypertension in 2024 HLD, unknown values  -Off aspirin and statin since 2009. Not interested to take statin due to side effects.  Encouraged to start aspirin 81 mg once daily. Echocardiogram performed at PCPs office showed LVEF 40 to 45%, and LVEF at the time of CABG in 2009 was 45%.  Not on GDMT, prefers to research medications before taking them. Printed the names of BB, ARB, SGLT2 inhibitors and MRA on the discharge instructions.  I will repeat the echocardiogram in 2 months and we will revisit this conversation in 3 months to initiate GDMT.  Patient in agreement with this plan.    I have spent a total duration of 45 minutes reviewing the PCPs notes, labs, cath report, CABG report, face-to-face discussion of the medical conditions, pathophysiology, evaluation, management, ordering test and documenting the findings in the note.   Disposition:  Follow up  3 months  Signed Aiyannah Fayad Verne Spurr, MD, 01/13/2023 4:53 PM    [facility] at [facility] [address] [place], [place], [place] [address]

## 2023-01-13 NOTE — Progress Notes (Unsigned)
Updated medical hx 

## 2023-01-13 NOTE — Patient Instructions (Signed)
Medication Instructions:  Your physician has recommended you make the following change in your medication:  Stop taking Farxiga, Metoprolol, Losartan and Aldactone  Start in taking Aspirin 81 mg once daily Continue taking all other medications as prescribed  Labwork: None  Testing/Procedures: Your physician has requested that you have an echocardiogram in 2 months. Echocardiography is a painless test that uses sound waves to create images of your heart. It provides your doctor with information about the size and shape of your heart and how well your heart's chambers and valves are working. This procedure takes approximately one hour. There are no restrictions for this procedure. Please do NOT wear cologne, perfume, aftershave, or lotions (deodorant is allowed). Please arrive 15 minutes prior to your appointment time.   Follow-Up: Your physician recommends that you schedule a follow-up appointment in: 3 months  Any Other Special Instructions Will Be Listed Below (If Applicable).  If you need a refill on your cardiac medications before your next appointment, please call your pharmacy.

## 2023-01-14 DIAGNOSIS — M1712 Unilateral primary osteoarthritis, left knee: Secondary | ICD-10-CM | POA: Diagnosis not present

## 2023-01-20 DIAGNOSIS — M1711 Unilateral primary osteoarthritis, right knee: Secondary | ICD-10-CM | POA: Diagnosis not present

## 2023-01-21 DIAGNOSIS — M1712 Unilateral primary osteoarthritis, left knee: Secondary | ICD-10-CM | POA: Diagnosis not present

## 2023-01-27 ENCOUNTER — Other Ambulatory Visit: Payer: Medicare Other

## 2023-01-27 DIAGNOSIS — M1711 Unilateral primary osteoarthritis, right knee: Secondary | ICD-10-CM | POA: Diagnosis not present

## 2023-01-28 DIAGNOSIS — M1712 Unilateral primary osteoarthritis, left knee: Secondary | ICD-10-CM | POA: Diagnosis not present

## 2023-02-03 DIAGNOSIS — M1711 Unilateral primary osteoarthritis, right knee: Secondary | ICD-10-CM | POA: Diagnosis not present

## 2023-02-04 DIAGNOSIS — M1712 Unilateral primary osteoarthritis, left knee: Secondary | ICD-10-CM | POA: Diagnosis not present

## 2023-02-10 DIAGNOSIS — M1711 Unilateral primary osteoarthritis, right knee: Secondary | ICD-10-CM | POA: Diagnosis not present

## 2023-02-11 DIAGNOSIS — M1712 Unilateral primary osteoarthritis, left knee: Secondary | ICD-10-CM | POA: Diagnosis not present

## 2023-02-14 DIAGNOSIS — Z299 Encounter for prophylactic measures, unspecified: Secondary | ICD-10-CM | POA: Diagnosis not present

## 2023-02-14 DIAGNOSIS — R5383 Other fatigue: Secondary | ICD-10-CM | POA: Diagnosis not present

## 2023-02-14 DIAGNOSIS — K5792 Diverticulitis of intestine, part unspecified, without perforation or abscess without bleeding: Secondary | ICD-10-CM | POA: Diagnosis not present

## 2023-02-14 DIAGNOSIS — I10 Essential (primary) hypertension: Secondary | ICD-10-CM | POA: Diagnosis not present

## 2023-02-25 DIAGNOSIS — Z6827 Body mass index (BMI) 27.0-27.9, adult: Secondary | ICD-10-CM | POA: Diagnosis not present

## 2023-02-25 DIAGNOSIS — Z299 Encounter for prophylactic measures, unspecified: Secondary | ICD-10-CM | POA: Diagnosis not present

## 2023-02-25 DIAGNOSIS — I10 Essential (primary) hypertension: Secondary | ICD-10-CM | POA: Diagnosis not present

## 2023-02-25 DIAGNOSIS — R2242 Localized swelling, mass and lump, left lower limb: Secondary | ICD-10-CM | POA: Diagnosis not present

## 2023-03-04 DIAGNOSIS — Z299 Encounter for prophylactic measures, unspecified: Secondary | ICD-10-CM | POA: Diagnosis not present

## 2023-03-04 DIAGNOSIS — I10 Essential (primary) hypertension: Secondary | ICD-10-CM | POA: Diagnosis not present

## 2023-03-04 DIAGNOSIS — R5383 Other fatigue: Secondary | ICD-10-CM | POA: Diagnosis not present

## 2023-03-11 DIAGNOSIS — I10 Essential (primary) hypertension: Secondary | ICD-10-CM | POA: Diagnosis not present

## 2023-03-11 DIAGNOSIS — Z299 Encounter for prophylactic measures, unspecified: Secondary | ICD-10-CM | POA: Diagnosis not present

## 2023-03-11 DIAGNOSIS — Z6827 Body mass index (BMI) 27.0-27.9, adult: Secondary | ICD-10-CM | POA: Diagnosis not present

## 2023-03-21 ENCOUNTER — Other Ambulatory Visit: Payer: Medicare Other

## 2023-03-22 ENCOUNTER — Ambulatory Visit: Payer: Medicare Other | Attending: Internal Medicine

## 2023-03-22 DIAGNOSIS — I119 Hypertensive heart disease without heart failure: Secondary | ICD-10-CM | POA: Diagnosis not present

## 2023-03-22 DIAGNOSIS — I43 Cardiomyopathy in diseases classified elsewhere: Secondary | ICD-10-CM | POA: Insufficient documentation

## 2023-03-22 DIAGNOSIS — I429 Cardiomyopathy, unspecified: Secondary | ICD-10-CM | POA: Diagnosis not present

## 2023-03-22 LAB — ECHOCARDIOGRAM COMPLETE
AR max vel: 0.95 cm2
AV Area VTI: 0.8 cm2
AV Area mean vel: 1.06 cm2
AV Mean grad: 32 mm[Hg]
AV Peak grad: 61 mm[Hg]
AV Vena cont: 0.3 cm
Ao pk vel: 3.91 m/s
Area-P 1/2: 3.66 cm2
Calc EF: 56.2 %
MV M vel: 7.2 m/s
MV Peak grad: 207.4 mm[Hg]
MV VTI: 1.51 cm2
P 1/2 time: 339 ms
S' Lateral: 4 cm
Single Plane A2C EF: 52.3 %
Single Plane A4C EF: 56.2 %

## 2023-04-04 ENCOUNTER — Telehealth: Payer: Self-pay | Admitting: Internal Medicine

## 2023-04-04 NOTE — Telephone Encounter (Signed)
Patient's wife is returning call to discuss echo results. 

## 2023-04-05 ENCOUNTER — Telehealth: Payer: Self-pay

## 2023-04-05 NOTE — Telephone Encounter (Signed)
 Patient informed and verbalized understanding of plan.

## 2023-04-05 NOTE — Telephone Encounter (Signed)
-----   Message from Vishnu P Mallipeddi sent at 04/04/2023  9:27 AM EST ----- Normal heart pumping function (improved from 40 to 45%), indeterminate diastology with elevated LA pressure, mild MR with mild MS, moderate to severe aortic valve stenosis (findings favor severe aortic stenosis) and aortic valve regurgitation is mild to moderate.  Keep appointment with cardiology in January 2025.

## 2023-04-05 NOTE — Telephone Encounter (Signed)
 Spoke with patient  Verbalized understanding

## 2023-04-19 ENCOUNTER — Ambulatory Visit: Payer: Medicare Other | Attending: Internal Medicine | Admitting: Internal Medicine

## 2023-04-19 ENCOUNTER — Encounter: Payer: Self-pay | Admitting: Internal Medicine

## 2023-04-19 VITALS — BP 130/82 | HR 73 | Ht 68.0 in | Wt 181.0 lb

## 2023-04-19 DIAGNOSIS — I080 Rheumatic disorders of both mitral and aortic valves: Secondary | ICD-10-CM | POA: Diagnosis not present

## 2023-04-19 NOTE — Patient Instructions (Addendum)
Medication Instructions:  Your physician recommends that you continue on your current medications as directed. Please refer to the Current Medication list given to you today.   Labwork: None  Testing/Procedures: Your physician has requested that you have an echocardiogram before 6 month follow up. Echocardiography is a painless test that uses sound waves to create images of your heart. It provides your doctor with information about the size and shape of your heart and how well your heart's chambers and valves are working. This procedure takes approximately one hour. There are no restrictions for this procedure. Please do NOT wear cologne, perfume, aftershave, or lotions (deodorant is allowed). Please arrive 15 minutes prior to your appointment time.  Please note: We ask at that you not bring children with you during ultrasound (echo/ vascular) testing. Due to room size and safety concerns, children are not allowed in the ultrasound rooms during exams. Our front office staff cannot provide observation of children in our lobby area while testing is being conducted. An adult accompanying a patient to their appointment will only be allowed in the ultrasound room at the discretion of the ultrasound technician under special circumstances. We apologize for any inconvenience.   Follow-Up: Your physician recommends that you schedule a follow-up appointment in: 6 months  Any Other Special Instructions Will Be Listed Below (If Applicable).  Thank you for choosing  [facility]!      If you need a refill on your cardiac medications before your next appointment, please call your pharmacy.

## 2023-04-19 NOTE — Progress Notes (Signed)
Cardiology Office Note  Date: 04/19/2023   ID: Fernando [name], DOB 07-07-1940, MRN [id number]  PCP:  Ignatius Specking, MD  Cardiologist:  Marjo Bicker, MD Electrophysiologist:  None   History of Present Illness: Fernando [name] is a 83 y.o. male known to have CAD s/p 5 vessel CABG in 2009, HLD, HTN, was referred to cardiology clinic to establish care.  In 2009, he was a 83 year old presented to visit to his PCPs office with 2 to 3 weeks of exertional upper back and shoulder pain, EKG performed at PCPs office showed dramatic ST segment changes due to which she underwent emergent LHC that showed 95% stenosis of the left main with probable thrombus as well as three-vessel CAD. He underwent emergent CABG, LIMA to LAD, SVG to diagonal, sequential SVG to OM1 and OM 2, SVG to RPDA.  LVEF at that time was 45%.  He has been off aspirin and statin since 2009.  He recently went to his PCPs office and underwent echocardiogram that showed LVEF 40 to 45%, valvular heart disease (moderate AS, mild to moderate AR, moderate TR, moderate MR), moderate pulmonary hypertension.  Repeat echocardiogram in December 2024 showed normal LVEF, moderate to severe area vaginosis and mild to moderate AR.    Patient is here for follow-up visit with me. No interval ER visits or hospitalizations. Patient denied any rest or exertional chest discomfort, tightness, heaviness or pressure, rest or exertional dyspnea, palpitations, light-headedness, syncope and LE swelling. Compliant with medications and no side-effects. No bleeding complications.    Past Medical History:  Diagnosis Date   Allergy    Ankle fracture, left    Coronary atherosclerosis of native coronary artery    Multivessel   Diverticulosis    Essential hypertension, benign    Heart murmur    Meningitis, pneumococcal 03/22/2012   Mixed hyperlipidemia     Past Surgical History:  Procedure Laterality Date   ADENOIDECTOMY     COLONOSCOPY      CORONARY ARTERY BYPASS GRAFT  March 2009   Dr. Donata Clay - LIMA to LAD, SVG to ramus, SVG to RCA, SVG to OM1 and OM 2   TONSILLECTOMY     transmastoid middle fossa repair     from pneum.meningitis     Current Outpatient Medications  Medication Sig Dispense Refill   Amino Acids (L-CARNITINE PO) Take 1 tablet by mouth daily.     aspirin EC 81 MG tablet Take 1 tablet (81 mg total) by mouth daily. Swallow whole. 30 tablet 2   Coenzyme Q10 (CO Q 10) 10 MG CAPS Take 10 mg by mouth daily.     Multiple Vitamin (MULTIVITAMIN) tablet Take 1 tablet by mouth daily.     Omega-3 1000 MG CAPS Take 1 g by mouth daily.     tamsulosin (FLOMAX) 0.4 MG CAPS capsule Take 0.4 mg by mouth daily.     vitamin E 100 UNIT capsule Take 100 Units by mouth daily.     No current facility-administered medications for this visit.   Allergies:  Patient has no known allergies.   Social History: The patient  reports that he has never smoked. He has never used smokeless tobacco. He reports that he does not drink alcohol and does not use drugs.   Family History: The patient's family history includes Colon cancer in his maternal uncle; Colon polyps in his father; Coronary artery disease in his father.   ROS:  Please see the history of present illness.  Otherwise, complete review of systems is positive for none  All other systems are reviewed and negative.   Physical Exam: VS:  BP 130/82 (BP Location: Right Arm, Cuff Size: Normal)   Pulse 73   Ht 5\' 8"  (1.727 m)   Wt 181 lb (82.1 kg)   SpO2 96%   BMI 27.52 kg/m , BMI Body mass index is 27.52 kg/m.  Wt Readings from Last 3 Encounters:  04/19/23 181 lb (82.1 kg)  01/13/23 181 lb (82.1 kg)  08/13/15 184 lb (83.5 kg)    General: Patient appears comfortable at rest. HEENT: Conjunctiva and lids normal, oropharynx clear with moist mucosa. Neck: Supple, no elevated JVP or carotid bruits, no thyromegaly. Lungs: Clear to auscultation, nonlabored breathing at  rest. Cardiac: Regular rate and rhythm, ejection systolic murmur with soft S2. Abdomen: Soft, nontender, no hepatomegaly, bowel sounds present, no guarding or rebound. Extremities: No pitting edema, distal pulses 2+. Skin: Warm and dry. Musculoskeletal: No kyphosis. Neuropsychiatric: Alert and oriented x3, affect grossly appropriate.  Recent Labwork: No results found for requested labs within last 365 days.  No results found for: "CHOL", "TRIG", "HDL", "CHOLHDL", "VLDL", "LDLCALC", "LDLDIRECT"    Assessment and Plan:  CAD manifested by ACS in 2009 s/p emergent 5v CABG (LIMA to LAD, SVG to diagonal, sequential SVG to OM1 and OM 2, SVG to RPDA) with LVEF 40 to 45% (45% at the time of CABG), currently angina free Moderate to severe AS (Mean PG 32, DVI 34, V-max 3.9/s, area 0.80 cm) Mild to moderate AR Mild MS, mild MR Moderate pulmonary hypertension in 2024 HLD, not at goal HTN, controlled   -Continue aspirin 81 mg once daily, not interested to be on statin due to side effects.  Echocardiogram from December 2024 showed normal LVEF, moderate to severe AS and mild to moderate AR. Mean PG 32, DVI 34, V-max 3.38m/s, area 0.80 cm.  Physical exam remarkable for HTN systolic murmur with soft S2.  Asymptomatic currently.  ER precautions provided for severe aortic valve symptoms.  Will repeat echocardiogram in 6 months. -He was previously started on BP by PCP, did not tolerate due to severe fatigue.  He self discontinued.  He also did not tolerate hydralazine due to symptoms, self discontinued.  Currently on valsartan for HTN.     Disposition:  Follow up  6 months  Signed Rashad Auld Verne Spurr, MD, 04/19/2023 3:48 PM    [facility] at [facility] [address] [place], [place], [place] [address]

## 2023-05-02 DIAGNOSIS — J069 Acute upper respiratory infection, unspecified: Secondary | ICD-10-CM | POA: Diagnosis not present

## 2023-05-02 DIAGNOSIS — R0981 Nasal congestion: Secondary | ICD-10-CM | POA: Diagnosis not present

## 2023-05-20 DIAGNOSIS — I10 Essential (primary) hypertension: Secondary | ICD-10-CM | POA: Diagnosis not present

## 2023-05-20 DIAGNOSIS — Z299 Encounter for prophylactic measures, unspecified: Secondary | ICD-10-CM | POA: Diagnosis not present

## 2023-05-20 DIAGNOSIS — R5383 Other fatigue: Secondary | ICD-10-CM | POA: Diagnosis not present

## 2023-05-20 DIAGNOSIS — I272 Pulmonary hypertension, unspecified: Secondary | ICD-10-CM | POA: Diagnosis not present

## 2023-06-20 DIAGNOSIS — L820 Inflamed seborrheic keratosis: Secondary | ICD-10-CM | POA: Diagnosis not present

## 2023-06-20 DIAGNOSIS — Z85828 Personal history of other malignant neoplasm of skin: Secondary | ICD-10-CM | POA: Diagnosis not present

## 2023-06-20 DIAGNOSIS — D485 Neoplasm of uncertain behavior of skin: Secondary | ICD-10-CM | POA: Diagnosis not present

## 2023-06-20 DIAGNOSIS — I781 Nevus, non-neoplastic: Secondary | ICD-10-CM | POA: Diagnosis not present

## 2023-09-16 DIAGNOSIS — L98499 Non-pressure chronic ulcer of skin of other sites with unspecified severity: Secondary | ICD-10-CM | POA: Diagnosis not present

## 2023-09-16 DIAGNOSIS — Z299 Encounter for prophylactic measures, unspecified: Secondary | ICD-10-CM | POA: Diagnosis not present

## 2023-09-16 DIAGNOSIS — I10 Essential (primary) hypertension: Secondary | ICD-10-CM | POA: Diagnosis not present

## 2023-09-16 DIAGNOSIS — I2510 Atherosclerotic heart disease of native coronary artery without angina pectoris: Secondary | ICD-10-CM | POA: Diagnosis not present

## 2023-09-20 DIAGNOSIS — H43813 Vitreous degeneration, bilateral: Secondary | ICD-10-CM | POA: Diagnosis not present

## 2023-09-20 DIAGNOSIS — H524 Presbyopia: Secondary | ICD-10-CM | POA: Diagnosis not present

## 2023-09-20 DIAGNOSIS — H25813 Combined forms of age-related cataract, bilateral: Secondary | ICD-10-CM | POA: Diagnosis not present

## 2023-09-20 DIAGNOSIS — H11153 Pinguecula, bilateral: Secondary | ICD-10-CM | POA: Diagnosis not present

## 2023-09-20 DIAGNOSIS — H5203 Hypermetropia, bilateral: Secondary | ICD-10-CM | POA: Diagnosis not present

## 2023-09-20 DIAGNOSIS — H52223 Regular astigmatism, bilateral: Secondary | ICD-10-CM | POA: Diagnosis not present

## 2023-10-19 ENCOUNTER — Ambulatory Visit: Payer: Medicare Other | Attending: Internal Medicine

## 2023-10-19 DIAGNOSIS — I080 Rheumatic disorders of both mitral and aortic valves: Secondary | ICD-10-CM | POA: Diagnosis not present

## 2023-10-19 LAB — ECHOCARDIOGRAM COMPLETE
AR max vel: 0.95 cm2
AV Area VTI: 0.96 cm2
AV Area mean vel: 0.91 cm2
AV Mean grad: 41 mmHg
AV Peak grad: 67.6 mmHg
AV Vena cont: 0.8 cm
Ao pk vel: 4.11 m/s
Area-P 1/2: 2.59 cm2
Calc EF: 59.4 %
MV VTI: 1.71 cm2
P 1/2 time: 420 ms
S' Lateral: 3.6 cm
Single Plane A2C EF: 64.5 %
Single Plane A4C EF: 55.8 %

## 2023-10-20 ENCOUNTER — Ambulatory Visit: Payer: Self-pay | Admitting: Internal Medicine

## 2023-10-24 NOTE — Telephone Encounter (Signed)
 The patient has been notified of the result and verbalized understanding.  All questions (if any) were answered. Littie CHRISTELLA Croak, CMA 10/24/2023 11:24 AM

## 2023-10-24 NOTE — Telephone Encounter (Signed)
-----   Message from Vishnu P Mallipeddi sent at 10/20/2023 11:24 AM EDT ----- Normal LV function, indeterminate diastology, normal RV function, mild MR/mild MS with mean mitral valve gradient 5 mm Hg at HR 70 bpm, mild to moderate AR, severe AS. AS has progressed to severe now  but he was asymptomatic per January 2025 office visit note.  He is supposed to be scheduled to see me in July 2025, I do not see an appointment.  Please schedule.  Provide ER precautions for severe  AS including dizziness, syncope, chest pain or SOB. ----- Message ----- From: Interface, [employer] Sent: 10/19/2023   2:08 PM EDT To: Vishnu P Mallipeddi, MD

## 2023-10-25 DIAGNOSIS — R5383 Other fatigue: Secondary | ICD-10-CM | POA: Diagnosis not present

## 2023-10-25 DIAGNOSIS — Z79899 Other long term (current) drug therapy: Secondary | ICD-10-CM | POA: Diagnosis not present

## 2023-10-25 DIAGNOSIS — I10 Essential (primary) hypertension: Secondary | ICD-10-CM | POA: Diagnosis not present

## 2023-10-25 DIAGNOSIS — Z7189 Other specified counseling: Secondary | ICD-10-CM | POA: Diagnosis not present

## 2023-10-25 DIAGNOSIS — Z299 Encounter for prophylactic measures, unspecified: Secondary | ICD-10-CM | POA: Diagnosis not present

## 2023-10-25 DIAGNOSIS — Z1331 Encounter for screening for depression: Secondary | ICD-10-CM | POA: Diagnosis not present

## 2023-10-25 DIAGNOSIS — Z0000 Encounter for general adult medical examination without abnormal findings: Secondary | ICD-10-CM | POA: Diagnosis not present

## 2023-10-25 DIAGNOSIS — Z1339 Encounter for screening examination for other mental health and behavioral disorders: Secondary | ICD-10-CM | POA: Diagnosis not present

## 2023-10-25 DIAGNOSIS — E7800 Pure hypercholesterolemia, unspecified: Secondary | ICD-10-CM | POA: Diagnosis not present

## 2023-10-26 ENCOUNTER — Encounter: Payer: Self-pay | Admitting: Internal Medicine

## 2023-10-26 ENCOUNTER — Ambulatory Visit: Attending: Internal Medicine | Admitting: Internal Medicine

## 2023-10-26 VITALS — BP 182/84 | HR 63 | Ht 68.0 in | Wt 185.0 lb

## 2023-10-26 DIAGNOSIS — I429 Cardiomyopathy, unspecified: Secondary | ICD-10-CM | POA: Insufficient documentation

## 2023-10-26 DIAGNOSIS — Z125 Encounter for screening for malignant neoplasm of prostate: Secondary | ICD-10-CM | POA: Diagnosis not present

## 2023-10-26 DIAGNOSIS — Z79899 Other long term (current) drug therapy: Secondary | ICD-10-CM | POA: Diagnosis not present

## 2023-10-26 DIAGNOSIS — R5383 Other fatigue: Secondary | ICD-10-CM | POA: Diagnosis not present

## 2023-10-26 DIAGNOSIS — I080 Rheumatic disorders of both mitral and aortic valves: Secondary | ICD-10-CM | POA: Diagnosis not present

## 2023-10-26 DIAGNOSIS — E559 Vitamin D deficiency, unspecified: Secondary | ICD-10-CM | POA: Diagnosis not present

## 2023-10-26 DIAGNOSIS — E7800 Pure hypercholesterolemia, unspecified: Secondary | ICD-10-CM | POA: Diagnosis not present

## 2023-10-26 NOTE — Patient Instructions (Addendum)
 Medication Instructions:  Your physician recommends that you continue on your current medications as directed. Please refer to the Current Medication list given to you today.   Labwork: BNP to be completed today at [facility] [place]  Testing/Procedures: Your physician has requested that you have an echocardiogram in 4 months. Echocardiography is a painless test that uses sound waves to create images of your heart. It provides your doctor with information about the size and shape of your heart and how well your heart's chambers and valves are working. This procedure takes approximately one hour. There are no restrictions for this procedure. Please do NOT wear cologne, perfume, aftershave, or lotions (deodorant is allowed). Please arrive 15 minutes prior to your appointment time.  Please note: We ask at that you not bring children with you during ultrasound (echo/ vascular) testing. Due to room size and safety concerns, children are not allowed in the ultrasound rooms during exams. Our front office staff cannot provide observation of children in our lobby area while testing is being conducted. An adult accompanying a patient to their appointment will only be allowed in the ultrasound room at the discretion of the ultrasound technician under special circumstances. We apologize for any inconvenience.   Follow-Up: Your physician recommends that you schedule a follow-up appointment in: 4 months  Any Other Special Instructions Will Be Listed Below (If Applicable). Thank you for choosing [facility]!     If you need a refill on your cardiac medications before your next appointment, please call your pharmacy.

## 2023-10-26 NOTE — Progress Notes (Signed)
 Patient refused to have his Blood pressure checked a second time.

## 2023-10-26 NOTE — Progress Notes (Signed)
 Cardiology Office Note  Date: 10/26/2023   ID: Fernando [name], DOB 1940-12-13, MRN [id number]  PCP:  Fernando Leta NOVAK, MD  Cardiologist:  Fernando SHAUNNA Maywood, MD Electrophysiologist:  None   History of Present Illness: Fernando [name] is a 83 y.o. male known to have CAD s/p 5 vessel CABG in 2009, HLD, HTN, valvular heart disease (mild MR/mild MS, mild to moderate AR, severe AS) is here for follow-up visit.  In 2009, he was a 83 year old presented to visit to his PCPs office with 2 to 3 weeks of exertional upper back and shoulder pain, EKG performed at PCPs office showed dramatic ST segment changes due to which he underwent emergent LHC that showed 95% stenosis of the left main with probable thrombus as well as three-vessel CAD. He underwent emergent CABG, LIMA to LAD, SVG to diagonal, sequential SVG to OM1 and OM 2, SVG to RPDA.  LVEF at that time was 45%.  He has been off aspirin  and statin since 2009.  He recently went to his PCPs office and underwent echocardiogram that showed LVEF 40 to 45%, valvular heart disease (moderate AS, mild to moderate AR, moderate TR, moderate MR), moderate pulmonary hypertension.  Repeat echocardiogram in December 2024 showed normal LVEF, moderate to severe aortic valve stenosis and mild to moderate AR.  Repeat echocardiogram in July 2025 showed normal LV function, indeterminate diastology, normal RV function, mild MR/mild MS, mild to moderate AR, severe AS.  Patient is here today for follow-up visit.  No angina or DOE.  No dizziness, lightheadedness, syncope.  No palpitations or leg swelling.  His METs are more than 4.  Physically active at baseline.     Past Medical History:  Diagnosis Date   Allergy    Ankle fracture, left    Coronary atherosclerosis of native coronary artery    Multivessel   Diverticulosis    Essential hypertension, benign    Heart murmur    Meningitis, pneumococcal 03/22/2012   Mixed hyperlipidemia     Past Surgical History:   Procedure Laterality Date   ADENOIDECTOMY     COLONOSCOPY     CORONARY ARTERY BYPASS GRAFT  March 2009   Dr. Fleeta Ochoa - LIMA to LAD, SVG to ramus, SVG to RCA, SVG to OM1 and OM 2   TONSILLECTOMY     transmastoid middle fossa repair     from pneum.meningitis     Current Outpatient Medications  Medication Sig Dispense Refill   Amino Acids (L-CARNITINE PO) Take 1 tablet by mouth daily.     aspirin  EC 81 MG tablet Take 1 tablet (81 mg total) by mouth daily. Swallow whole. 30 tablet 2   Coenzyme Q10 (CO Q 10) 10 MG CAPS Take 10 mg by mouth daily.     Multiple Vitamin (MULTIVITAMIN) tablet Take 1 tablet by mouth daily.     Omega-3 1000 MG CAPS Take 1 g by mouth daily.     tamsulosin (FLOMAX) 0.4 MG CAPS capsule Take 0.4 mg by mouth daily.     valsartan (DIOVAN) 320 MG tablet Take 320 mg by mouth daily.     vitamin E 100 UNIT capsule Take 100 Units by mouth daily.     doxycycline (VIBRAMYCIN) 100 MG capsule Take 100 mg by mouth 2 (two) times daily. (Patient not taking: Reported on 10/26/2023)     No current facility-administered medications for this visit.   Allergies:  Patient has no known allergies.   Social History: The patient  reports that he has never smoked. He has never used smokeless tobacco. He reports that he does not drink alcohol  and does not use drugs.   Family History: The patient's family history includes Colon cancer in his maternal uncle; Colon polyps in his father; Coronary artery disease in his father.   ROS:  Please see the history of present illness. Otherwise, complete review of systems is positive for none  All other systems are reviewed and negative.   Physical Exam: VS:  BP (!) 182/84 (BP Location: Left Arm, Cuff Size: Normal)   Pulse 63   Ht 5' 8 (1.727 m)   Wt 185 lb (83.9 kg)   SpO2 97%   BMI 28.13 kg/m , BMI Body mass index is 28.13 kg/m.  Wt Readings from Last 3 Encounters:  10/26/23 185 lb (83.9 kg)  04/19/23 181 lb (82.1 kg)  01/13/23 181 lb  (82.1 kg)    General: Patient appears comfortable at rest. HEENT: Conjunctiva and lids normal, oropharynx clear with moist mucosa. Neck: Supple, no elevated JVP or carotid bruits, no thyromegaly. Lungs: Clear to auscultation, nonlabored breathing at rest. Cardiac: Regular rate and rhythm, ejection systolic murmur with soft S2. Abdomen: Soft, nontender, no hepatomegaly, bowel sounds present, no guarding or rebound. Extremities: No pitting edema, distal pulses 2+. Skin: Warm and dry. Musculoskeletal: No kyphosis. Neuropsychiatric: Alert and oriented x3, affect grossly appropriate.  Recent Labwork: No results found for requested labs within last 365 days.  No results found for: CHOL, TRIG, HDL, CHOLHDL, VLDL, LDLCALC, LDLDIRECT    Assessment and Plan:  Severe aortic valve stenosis Mild to moderate aortic valve regurgitation Mild MS/mild MR - Asymptomatic. - METs more than 4, physically active at baseline. - Physical exam findings consistent with severe AS. - Obtain BNP today. - ER precautions for severe AS provided. - Repeat echocardiogram in 4 months. - Return to follow-up in 4 months.  CAD manifested by ACS in 2009 s/p emergent 5v CABG (LIMA to LAD, SVG to diagonal, sequential SVG to OM1 and OM 2, SVG to RPDA) with LVEF 40 to 45% (45% at the time of CABG), currently angina free - Continue aspirin  81 mg once daily. - Not interested to be on statin due to side effects.  Moderate pulmonary hypertension in 2024 - Asymptomatic. - Continue to monitor.  HTN with an element of whitecoat HTN - Home blood pressures range between 130 and 150 mmHg. - Continue valsartan 320 mg once daily. - Previously did not tolerate hydralazine.     Disposition:  Follow up 4 months  Signed Taraneh Metheney Priya Tonie Elsey, MD, 10/26/2023 11:56 AM    [facility] at [facility] [address] [place], [place], [place] [address]

## 2023-11-01 ENCOUNTER — Telehealth: Payer: Self-pay | Admitting: Internal Medicine

## 2023-11-01 DIAGNOSIS — I080 Rheumatic disorders of both mitral and aortic valves: Secondary | ICD-10-CM

## 2023-11-01 NOTE — Telephone Encounter (Signed)
 Patient is requesting a call back to review lab results.

## 2023-11-04 NOTE — Telephone Encounter (Signed)
 Per Dr. Mallipeddi:  Place referral to structural heart clinic. For severe AS.   Spoke with patient and advised him of recommendation. Will place referral to structural heart and a scheduler will reach out to you and make an appointment. Patient verbalized understanding

## 2023-11-15 NOTE — Progress Notes (Signed)
 Patient ID: Fernando [name] MRN: [id number] DOB/AGE: 25-Sep-1940 82 y.o.  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 The patient is a 83 year old male with the above medical problems referred for recommendations regarding his aortic valvular disease.  He has been followed by Dr. Mallipeddi.  An echocardiogram performed in December 2024 demonstrated moderate to severe aortic stenosis.  Echocardiogram done in July of this year demonstrated progression with a mean gradient of over 40.      He is able to perform activities such as mowing, weed eating, and driving his tractor without experiencing shortness of breath or chest discomfort. No lightheadedness or syncope. He notes occasional peripheral edema, particularly when consuming a lot of fluids, but no significant dyspnea or fatigue.  His medication regimen includes tamsulosin and antihypertensive medication. He was previously on fish oil but has switched to Creolol. He takes a baby aspirin  daily.  In 2014, he had a significant medical event involving spinal meningitis, which required intensive care and surgery on his ear. He recalls being told that the infection had spread to his brain, and he was considered very fortunate to have survived.  He also has a history of Morton's neuroma, which affects his ability to ambulate comfortably. He has received cortisone shots and gel injections in his knees, which have not provided significant relief.  He did not has any  dental issues and sees a dentist on a yearly.   Past Medical History:  Diagnosis Date   Allergy    Ankle fracture, left    Coronary atherosclerosis of native coronary artery    Multivessel   Diverticulosis    Essential hypertension, benign    Heart murmur    Meningitis, pneumococcal 03/22/2012   Mixed hyperlipidemia     Past Surgical History:  Procedure Laterality Date   ADENOIDECTOMY     COLONOSCOPY     CORONARY ARTERY BYPASS GRAFT  March 2009   Dr. Fleeta Ochoa - LIMA to LAD, SVG to ramus, SVG to RCA, SVG to OM1 and OM 2   TONSILLECTOMY     transmastoid middle fossa repair     from pneum.meningitis     Family History  Problem Relation Age of Onset   Coronary artery disease Father    Colon polyps Father    Colon cancer Maternal Uncle    Rectal cancer Neg Hx    Stomach cancer Neg Hx     Social History   Socioeconomic History   Marital status: Married    Spouse name: Not on file   Number of children: Not on file   Years of education: Not on file   Highest education level: Not on file  Occupational History   Occupation: Retired    Associate Professor: [employer]  Tobacco Use   Smoking status: Never   Smokeless tobacco: Never  Vaping Use   Vaping status: Never Used  Substance and Sexual Activity   Alcohol  use: No    Alcohol /week: 0.0 standard drinks  of alcohol    Drug use: No   Sexual activity: Not on file  Other Topics Concern   Not on file  Social History Narrative   Not on file   Social Drivers of Health   Financial Resource Strain: Not on file  Food Insecurity: Not on file  Transportation Needs: No Transportation Needs (03/17/2022)   Received from [facility] - Transportation    Lack of Transportation (Medical): No    Lack of Transportation (Non-Medical): No  Physical Activity: Not on file  Stress: Not on file  Social Connections: Not on file  Intimate Partner Violence: Not on file     Prior to Admission medications   Medication Sig Start Date  End Date Taking? Authorizing Provider  Amino Acids (L-CARNITINE PO) Take 1 tablet by mouth daily.    [provider]  aspirin  EC 81 MG tablet Take 1 tablet (81 mg total) by mouth daily. Swallow whole. 01/13/23   Mallipeddi, Vishnu P, MD  Coenzyme Q10 (CO Q 10) 10 MG CAPS Take 10 mg by mouth daily.    [provider]  doxycycline (VIBRAMYCIN) 100 MG capsule Take 100 mg by mouth 2 (two) times daily. Patient not taking: Reported on 10/26/2023 10/25/23   [provider]  Multiple Vitamin (MULTIVITAMIN) tablet Take 1 tablet by mouth daily.    [provider]  Omega-3 1000 MG CAPS Take 1 g by mouth daily.    [provider]  tamsulosin (FLOMAX) 0.4 MG CAPS capsule Take 0.4 mg by mouth daily.    [provider]  valsartan (DIOVAN) 320 MG tablet Take 320 mg by mouth daily. 08/24/23   [provider]  vitamin E 100 UNIT capsule Take 100 Units by mouth daily.    [provider]    No Known Allergies  REVIEW OF SYSTEMS:  General: no fevers/chills/night sweats Eyes: no blurry vision, diplopia, or amaurosis ENT: no sore throat or hearing loss Resp: no cough, wheezing, or hemoptysis CV: no edema or palpitations GI: no abdominal pain, nausea, vomiting, diarrhea, or constipation GU: no dysuria, frequency, or hematuria Skin: no rash Neuro: no headache, numbness, tingling, or weakness of extremities Musculoskeletal: no joint pain or swelling Heme: no bleeding, DVT, or easy bruising Endo: no polydipsia or polyuria  BP (!) 146/80   Pulse 68   Ht 5' 8 (1.727 m)   Wt 178 lb 9.6 oz (81 kg)   SpO2 96%   BMI 27.16 kg/m   PHYSICAL EXAM: GEN:  AO x 3 in no acute distress HEENT: normal Dentition: Normal Neck: JVP normal. +2 carotid upstrokes without bruits. No thyromegaly. Lungs: equal expansion, clear bilaterally CV: Apex is discrete and nondisplaced, RRR with 3 out of 6 crescendo decrescendo murmur Abd: soft, non-tender,  non-distended; no bruit; positive bowel sounds Ext: no edema, ecchymoses, or cyanosis Vascular: 2+ femoral pulses, 2+ radial pulses       Skin: warm and dry without rash Neuro: CN II-XII grossly intact; motor and sensory grossly intact    DATA AND STUDIES:  EKG: 2025 normal sinus rhythm without bundle-branch blocks  EKG Interpretation Date/Time:    Ventricular Rate:    PR Interval:    QRS Duration:    QT Interval:    QTC Calculation:   R Axis:      Text Interpretation:          CARDIAC STUDIES: Refer to CV Procedures and Imaging Tabs  No results found for requested labs within  last 365 days.   STS RISK CALCULATOR: Pending  NHYA CLASS: 1    ASSESSMENT AND PLAN:   1. Nonrheumatic aortic valve stenosis   2. Mitral valve disease   3. S/P CABG x 5   4. Hyperlipidemia LDL goal <70   5. Primary hypertension   6. BMI 28.0-28.9,adult     Aortic stenosis: The patient has developed severe aortic stenosis.  I am concerned that due to the patient's Morton's neuroma and knee issues that he does not exert himself enough to elicit symptoms.  We had a long talk about the natural history of aortic stenosis.  At this point in time he would like to defer an evaluation.  I will see him back in 3 months time.  He has an echocardiogram already scheduled for December 8.  I will see him after this.  I told him to contact our office if he were to develop any worsening shortness of breath, chest discomfort, or presyncope/syncope.  He and his wife understand and agree with this plan. Mitral valvular disease: Mild MR and mild MS on most recent echocardiogram.  No indication for intervention. Coronary artery disease status post CABG x 5: Continue aspirin  81 mg.  EF normal; no need for beta-blocker. Hyperlipidemia: Would defer intensive lipid-lowering at this advanced age patient. Hypertension: Continue valsartan 320 mg daily; BP slightly above goal and will defer treatment until severe aortic  stenosis has been managed Elevated BMI: Diet and exercise modification   I have personally reviewed the patients imaging data as summarized above.  I have reviewed the natural history of aortic stenosis with the patient and family members who are present today. We have discussed the limitations of medical therapy and the poor prognosis associated with symptomatic aortic stenosis. We have also reviewed potential treatment options, including palliative medical therapy, conventional surgical aortic valve replacement, and transcatheter aortic valve replacement. We discussed treatment options in the context of this patient's specific comorbid medical conditions.   All of the patient's questions were answered today. Will make further recommendations based on the results of studies outlined above.   I spent 48 minutes reviewing all clinical data during and prior to this visit including all relevant imaging studies, laboratories, clinical information from other health systems and prior notes from both Cardiology and other specialties, interviewing the patient, conducting a complete physical examination, and coordinating care in order to formulate a comprehensive and personalized evaluation and treatment plan.   Eshaan Titzer K Davonna Ertl, MD  11/24/2023 5:17 PM    [facility] [address] [place], [place], [place]  [address] Phone: [phone]; Fax: [phone]

## 2023-11-16 ENCOUNTER — Other Ambulatory Visit

## 2023-11-22 DIAGNOSIS — M25561 Pain in right knee: Secondary | ICD-10-CM | POA: Diagnosis not present

## 2023-11-22 DIAGNOSIS — Z299 Encounter for prophylactic measures, unspecified: Secondary | ICD-10-CM | POA: Diagnosis not present

## 2023-11-24 ENCOUNTER — Ambulatory Visit: Attending: Internal Medicine | Admitting: Internal Medicine

## 2023-11-24 ENCOUNTER — Encounter: Payer: Self-pay | Admitting: Internal Medicine

## 2023-11-24 VITALS — BP 146/80 | HR 68 | Ht 68.0 in | Wt 178.6 lb

## 2023-11-24 DIAGNOSIS — I350 Nonrheumatic aortic (valve) stenosis: Secondary | ICD-10-CM | POA: Diagnosis not present

## 2023-11-24 DIAGNOSIS — Z6828 Body mass index (BMI) 28.0-28.9, adult: Secondary | ICD-10-CM | POA: Insufficient documentation

## 2023-11-24 DIAGNOSIS — I10 Essential (primary) hypertension: Secondary | ICD-10-CM | POA: Diagnosis not present

## 2023-11-24 DIAGNOSIS — E785 Hyperlipidemia, unspecified: Secondary | ICD-10-CM | POA: Insufficient documentation

## 2023-11-24 DIAGNOSIS — Z951 Presence of aortocoronary bypass graft: Secondary | ICD-10-CM | POA: Insufficient documentation

## 2023-11-24 DIAGNOSIS — I059 Rheumatic mitral valve disease, unspecified: Secondary | ICD-10-CM | POA: Diagnosis present

## 2023-11-24 NOTE — Patient Instructions (Signed)
 Medication Instructions:  Your physician recommends that you continue on your current medications as directed. Please refer to the Current Medication list given to you today.  *If you need a refill on your cardiac medications before your next appointment, please call your pharmacy*  Follow-Up: At [facility], you and your health needs are our priority.  As part of our continuing mission to provide you with exceptional heart care, our providers are all part of one team.  This team includes your primary Cardiologist (physician) and Advanced Practice Providers or APPs (Physician Assistants and Nurse Practitioners) who all work together to provide you with the care you need, when you need it.  Your next appointment:   3 month(s) (in December after echocardiogram)  Provider:   Lurena Red, MD  We recommend signing up for the patient portal called MyChart.  Sign up information is provided on this After Visit Summary.  MyChart is used to connect with patients for Virtual Visits (Telemedicine).  Patients are able to view lab/test results, encounter notes, upcoming appointments, etc.  Non-urgent messages can be sent to your provider as well.    To learn more about what you can do with MyChart, go to [email or web address].

## 2023-12-20 DIAGNOSIS — L570 Actinic keratosis: Secondary | ICD-10-CM | POA: Diagnosis not present

## 2023-12-20 DIAGNOSIS — L814 Other melanin hyperpigmentation: Secondary | ICD-10-CM | POA: Diagnosis not present

## 2023-12-20 DIAGNOSIS — L821 Other seborrheic keratosis: Secondary | ICD-10-CM | POA: Diagnosis not present

## 2023-12-20 DIAGNOSIS — Z85828 Personal history of other malignant neoplasm of skin: Secondary | ICD-10-CM | POA: Diagnosis not present

## 2024-02-20 NOTE — Progress Notes (Deleted)
 Patient ID: Fernando [name] MRN: [id number] DOB/AGE: 04/18/1940 83 y.o.  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 The patient is a 83 year old male with the above medical problems referred for recommendations regarding his aortic valvular disease.  He has been followed by Dr. Mallipeddi.  An echocardiogram performed in December 2024 demonstrated moderate to severe aortic stenosis.  Echocardiogram done in July of this year demonstrated progression with a mean gradient of over 40.      He is able to perform activities such as mowing, weed eating, and driving his tractor without experiencing shortness of breath or chest discomfort. No lightheadedness or syncope. He notes occasional peripheral edema, particularly when consuming a lot of fluids, but no significant dyspnea or fatigue.  His medication regimen includes tamsulosin and antihypertensive medication. He was previously on fish oil but has switched to Creolol. He takes a baby aspirin  daily.  In 2014, he had a significant medical event involving spinal meningitis, which required intensive care and surgery on his ear. He recalls being told that the infection had spread to his brain, and he was considered very fortunate to have survived.  He also has a history of Morton's neuroma, which affects his ability to ambulate comfortably. He has received cortisone shots and gel injections in his knees, which have not provided significant  relief.  He did not has any dental issues and sees a dentist on a yearly.  Plan: Follow-up in 3 months.   Past Medical History:  Diagnosis Date   Allergy    Ankle fracture, left    Coronary atherosclerosis of native coronary artery    Multivessel   Diverticulosis    Essential hypertension, benign    Heart murmur    Meningitis, pneumococcal 03/22/2012   Mixed hyperlipidemia     Past Surgical History:  Procedure Laterality Date   ADENOIDECTOMY     COLONOSCOPY     CORONARY ARTERY BYPASS GRAFT  March 2009   Dr. Fleeta Ochoa - LIMA to LAD, SVG to ramus, SVG to RCA, SVG to OM1 and OM 2   TONSILLECTOMY     transmastoid middle fossa repair     from pneum.meningitis     Family History  Problem Relation Age of Onset   Coronary artery disease Father    Colon polyps Father    Colon cancer Maternal Uncle    Rectal cancer Neg Hx    Stomach cancer Neg Hx     Social History   Socioeconomic History   Marital status: Married    Spouse name: Not on file   Number of children: Not on file   Years of education: Not on file   Highest education level: Not on file  Occupational History   Occupation: Retired    Associate Professor: [employer]  Tobacco Use   Smoking status: Never   Smokeless tobacco: Never  Vaping Use   Vaping status: Never Used  Substance and Sexual Activity   Alcohol   use: No    Alcohol /week: 0.0 standard drinks of alcohol    Drug use: No   Sexual activity: Not on file  Other Topics Concern   Not on file  Social History Narrative   Not on file   Social Drivers of Health   Financial Resource Strain: Not on file  Food Insecurity: Not on file  Transportation Needs: No Transportation Needs (03/17/2022)   Received from [facility]   [facility] - Transportation    Lack of Transportation (Medical): No    Lack of Transportation (Non-Medical): No  Physical Activity: Not on file  Stress: Not on file  Social Connections: Not on file  Intimate Partner Violence: Not on file      Prior to Admission medications   Medication Sig Start Date End Date Taking? Authorizing Provider  Amino Acids (L-CARNITINE PO) Take 1 tablet by mouth daily.    [provider]  aspirin  EC 81 MG tablet Take 1 tablet (81 mg total) by mouth daily. Swallow whole. 01/13/23   Mallipeddi, Vishnu P, MD  Coenzyme Q10 (CO Q 10) 10 MG CAPS Take 10 mg by mouth daily.    [provider]  doxycycline (VIBRAMYCIN) 100 MG capsule Take 100 mg by mouth 2 (two) times daily. Patient not taking: Reported on 10/26/2023 10/25/23   [provider]  Multiple Vitamin (MULTIVITAMIN) tablet Take 1 tablet by mouth daily.    [provider]  Omega-3 1000 MG CAPS Take 1 g by mouth daily.    [provider]  tamsulosin (FLOMAX) 0.4 MG CAPS capsule Take 0.4 mg by mouth daily.    [provider]  valsartan (DIOVAN) 320 MG tablet Take 320 mg by mouth daily. 08/24/23   [provider]  vitamin E 100 UNIT capsule Take 100 Units by mouth daily.    [provider]    No Known Allergies  REVIEW OF SYSTEMS:  General: no fevers/chills/night sweats Eyes: no blurry vision, diplopia, or amaurosis ENT: no sore throat or hearing loss Resp: no cough, wheezing, or hemoptysis CV: no edema or palpitations GI: no abdominal pain, nausea, vomiting, diarrhea, or constipation GU: no dysuria, frequency, or hematuria Skin: no rash Neuro: no headache, numbness, tingling, or weakness of extremities Musculoskeletal: no joint pain or swelling Heme: no bleeding, DVT, or easy bruising Endo: no polydipsia or polyuria  There were no vitals taken for this visit.  PHYSICAL EXAM: GEN:  AO x 3 in no acute distress HEENT: normal Dentition: Normal Neck: JVP normal. +2 carotid upstrokes without bruits. No thyromegaly. Lungs: equal expansion, clear bilaterally CV: Apex is discrete and nondisplaced, RRR with 3 out of 6 crescendo decrescendo murmur Abd: soft, non-tender,  non-distended; no bruit; positive bowel sounds Ext: no edema, ecchymoses, or cyanosis Vascular: 2+ femoral pulses, 2+ radial pulses       Skin: warm and dry without rash Neuro: CN II-XII grossly intact; motor and sensory grossly intact    DATA AND STUDIES:  EKG: 2025 normal sinus rhythm, low voltage QRS without bundle-branch blocks  EKG Interpretation Date/Time:    Ventricular Rate:    PR Interval:    QRS Duration:    QT Interval:    QTC Calculation:   R Axis:      Text Interpretation:          CARDIAC STUDIES: Refer to CV Procedures and Imaging Tabs  No results found for requested labs within last 365 days.   STS RISK CALCULATOR: Pending  NHYA CLASS: 1  ASSESSMENT AND PLAN:   1. Nonrheumatic aortic valve stenosis   2. Mitral valve disease   3. S/P CABG x 5   4. Hyperlipidemia LDL goal <70   5. Primary hypertension   6. BMI 28.0-28.9,adult      Aortic stenosis: ***. Mitral valvular disease: Mild MR and mild MS on most recent echocardiogram.  No indication for intervention. Coronary artery disease status post CABG x 5: Continue aspirin  81 mg.  EF normal; no need for beta-blocker. Hyperlipidemia: Would defer intensive lipid-lowering at this advanced age patient. Hypertension: Continue valsartan 320 mg daily.*** Elevated BMI: Diet and exercise modification   I have personally reviewed the patients imaging data as summarized above.  I have reviewed the natural history of aortic stenosis with the patient and family members who are present today. We have discussed the limitations of medical therapy and the poor prognosis associated with symptomatic aortic stenosis. We have also reviewed potential treatment options, including palliative medical therapy, conventional surgical aortic valve replacement, and transcatheter aortic valve replacement. We discussed treatment options in the context of this patient's specific comorbid medical conditions.   All of the  patient's questions were answered today. Will make further recommendations based on the results of studies outlined above.   I spent *** minutes reviewing all clinical data during and prior to this visit including all relevant imaging studies, laboratories, clinical information from other health systems and prior notes from both Cardiology and other specialties, interviewing the patient, conducting a complete physical examination, and coordinating care in order to formulate a comprehensive and personalized evaluation and treatment plan.   Cheryle Dark K Analicia Skibinski, MD  02/20/2024 2:57 PM    [facility] [address] [place], [place], [place]  [address] Phone: [phone]; Fax: [phone]

## 2024-02-27 ENCOUNTER — Other Ambulatory Visit

## 2024-02-29 DIAGNOSIS — L820 Inflamed seborrheic keratosis: Secondary | ICD-10-CM | POA: Diagnosis not present

## 2024-03-01 ENCOUNTER — Ambulatory Visit: Admitting: Internal Medicine

## 2024-03-01 DIAGNOSIS — I350 Nonrheumatic aortic (valve) stenosis: Secondary | ICD-10-CM

## 2024-03-01 DIAGNOSIS — E785 Hyperlipidemia, unspecified: Secondary | ICD-10-CM

## 2024-03-01 DIAGNOSIS — I059 Rheumatic mitral valve disease, unspecified: Secondary | ICD-10-CM

## 2024-03-01 DIAGNOSIS — Z951 Presence of aortocoronary bypass graft: Secondary | ICD-10-CM

## 2024-03-01 DIAGNOSIS — Z6828 Body mass index (BMI) 28.0-28.9, adult: Secondary | ICD-10-CM

## 2024-03-01 DIAGNOSIS — I10 Essential (primary) hypertension: Secondary | ICD-10-CM

## 2024-03-05 ENCOUNTER — Ambulatory Visit: Admitting: Internal Medicine

## 2024-03-08 ENCOUNTER — Ambulatory Visit

## 2024-03-09 ENCOUNTER — Ambulatory Visit: Admitting: Internal Medicine

## 2024-03-12 ENCOUNTER — Ambulatory Visit: Attending: Internal Medicine

## 2024-03-12 DIAGNOSIS — I080 Rheumatic disorders of both mitral and aortic valves: Secondary | ICD-10-CM | POA: Insufficient documentation

## 2024-03-13 ENCOUNTER — Encounter: Payer: Self-pay | Admitting: Internal Medicine

## 2024-03-13 ENCOUNTER — Ambulatory Visit: Attending: Internal Medicine | Admitting: Internal Medicine

## 2024-03-13 VITALS — BP 174/82 | HR 82 | Ht 69.0 in | Wt 183.0 lb

## 2024-03-13 DIAGNOSIS — I080 Rheumatic disorders of both mitral and aortic valves: Secondary | ICD-10-CM | POA: Diagnosis present

## 2024-03-13 LAB — ECHOCARDIOGRAM COMPLETE
AR max vel: 0.79 cm2
AV Area VTI: 0.83 cm2
AV Area mean vel: 0.82 cm2
AV Mean grad: 40 mmHg
AV Peak grad: 75.7 mmHg
AV Vena cont: 0.8 cm
Ao pk vel: 4.35 m/s
Area-P 1/2: 3.37 cm2
Calc EF: 55.3 %
MV VTI: 1.9 cm2
P 1/2 time: 347 ms
S' Lateral: 3.8 cm
Single Plane A2C EF: 60.9 %
Single Plane A4C EF: 51.4 %

## 2024-03-13 NOTE — Patient Instructions (Signed)
 Medication Instructions:   Your physician recommends that you continue on your current medications as directed. Please refer to the Current Medication list given to you today.   Labwork: None today  Testing/Procedures: Your physician has requested that you have an echocardiogram in 6 months. Echocardiography is a painless test that uses sound waves to create images of your heart. It provides your doctor with information about the size and shape of your heart and how well your hearts chambers and valves are working. This procedure takes approximately one hour. There are no restrictions for this procedure. Please do NOT wear cologne, perfume, aftershave, or lotions (deodorant is allowed). Please arrive 15 minutes prior to your appointment time.  Please note: We ask at that you not bring children with you during ultrasound (echo/ vascular) testing. Due to room size and safety concerns, children are not allowed in the ultrasound rooms during exams. Our front office staff cannot provide observation of children in our lobby area while testing is being conducted. An adult accompanying a patient to their appointment will only be allowed in the ultrasound room at the discretion of the ultrasound technician under special circumstances. We apologize for any inconvenience.   Follow-Up: 6 months after echo with Dr.Mallipeddi  Any Other Special Instructions Will Be Listed Below (If Applicable).  If you need a refill on your cardiac medications before your next appointment, please call your pharmacy.

## 2024-03-13 NOTE — Progress Notes (Signed)
 "    Cardiology Office Note  Date: 03/13/2024   ID: [name], DOB October 12, 1940, MRN [id number]  PCP:  Rosamond Leta NOVAK, MD  Cardiologist:  Diannah SHAUNNA Maywood, MD Electrophysiologist:  None   History of Present Illness: Fernando [name] is a 83 y.o. male known to have CAD s/p 5 vessel CABG in 2009, HLD, HTN, valvular heart disease (mild MR/mild MS, mild to moderate AR, severe AS) is here for follow-up visit.  In 2009, he was a 83 year old presented to visit to his PCPs office with 2 to 3 weeks of exertional upper back and shoulder pain, EKG performed at PCPs office showed dramatic ST segment changes due to which he underwent emergent LHC that showed 95% stenosis of the left main with probable thrombus as well as three-vessel CAD. He underwent emergent CABG, LIMA to LAD, SVG to diagonal, sequential SVG to OM1 and OM 2, SVG to RPDA.  LVEF at that time was 45%.  He has been off aspirin  and statin since 2009.  He recently went to his PCPs office and underwent echocardiogram that showed LVEF 40 to 45%, valvular heart disease (moderate AS, mild to moderate AR, moderate TR, moderate MR), moderate pulmonary hypertension.  Repeat echocardiogram in December 2024 showed normal LVEF, moderate to severe aortic valve stenosis and mild to moderate AR.  Repeat echocardiogram in July 2025 showed normal LV function, indeterminate diastology, normal RV function, mild MR/mild MS, mild to moderate AR, severe AS.  Repeat echocardiogram in December 25 showed normal LVEF, mild LVH, normal RV function, mild MR, mild AR, severe AS and CVP 3 mmHg.  He was referred to structural cardiology.  Patient wanted to wait until he has symptoms.  He is active at baseline.  But lately not much active due to knee issues.  Does not have any symptoms of angina, DOE, lightheadedness, dizziness, syncope.  No leg swelling.     Past Medical History:  Diagnosis Date   Allergy    Ankle fracture, left    Coronary atherosclerosis of  native coronary artery    Multivessel   Diverticulosis    Essential hypertension, benign    Heart murmur    Meningitis, pneumococcal 03/22/2012   Mixed hyperlipidemia     Past Surgical History:  Procedure Laterality Date   ADENOIDECTOMY     COLONOSCOPY     CORONARY ARTERY BYPASS GRAFT  March 2009   Dr. Fleeta Ochoa - LIMA to LAD, SVG to ramus, SVG to RCA, SVG to OM1 and OM 2   TONSILLECTOMY     transmastoid middle fossa repair     from pneum.meningitis     Current Outpatient Medications  Medication Sig Dispense Refill   Amino Acids (L-CARNITINE PO) Take 1 tablet by mouth daily.     aspirin  EC 81 MG tablet Take 1 tablet (81 mg total) by mouth daily. Swallow whole. 30 tablet 2   Coenzyme Q10 (CO Q 10) 10 MG CAPS Take 10 mg by mouth daily.     doxycycline (VIBRAMYCIN) 100 MG capsule Take 100 mg by mouth 2 (two) times daily.     Multiple Vitamin (MULTIVITAMIN) tablet Take 1 tablet by mouth daily.     Omega-3 1000 MG CAPS Take 1 g by mouth daily.     tamsulosin (FLOMAX) 0.4 MG CAPS capsule Take 0.4 mg by mouth daily.     valsartan (DIOVAN) 320 MG tablet Take 320 mg by mouth daily.     vitamin E 100 UNIT capsule Take  100 Units by mouth daily.     No current facility-administered medications for this visit.   Allergies:  Patient has no known allergies.   Social History: The patient  reports that he has never smoked. He has never used smokeless tobacco. He reports that he does not drink alcohol  and does not use drugs.   Family History: The patient's family history includes Colon cancer in his maternal uncle; Colon polyps in his father; Coronary artery disease in his father.   ROS:  Please see the history of present illness. Otherwise, complete review of systems is positive for none  All other systems are reviewed and negative.   Physical Exam: VS:  BP (!) 174/82 (BP Location: Right Arm, Cuff Size: Normal)   Pulse 82   Ht 5' 9 (1.753 m)   Wt 183 lb (83 kg)   SpO2 94%   BMI 27.02  kg/m , BMI Body mass index is 27.02 kg/m.  Wt Readings from Last 3 Encounters:  03/13/24 183 lb (83 kg)  11/24/23 178 lb 9.6 oz (81 kg)  10/26/23 185 lb (83.9 kg)    General: Patient appears comfortable at rest. HEENT: Conjunctiva and lids normal, oropharynx clear with moist mucosa. Neck: Supple, no elevated JVP or carotid bruits, no thyromegaly. Lungs: Clear to auscultation, nonlabored breathing at rest. Cardiac: Regular rate and rhythm, ejection systolic murmur with soft S2. Abdomen: Soft, nontender, no hepatomegaly, bowel sounds present, no guarding or rebound. Extremities: No pitting edema, distal pulses 2+. Skin: Warm and dry. Musculoskeletal: No kyphosis. Neuropsychiatric: Alert and oriented x3, affect grossly appropriate.  Recent Labwork: No results found for requested labs within last 365 days.  No results found for: CHOL, TRIG, HDL, CHOLHDL, VLDL, LDLCALC, LDLDIRECT    Assessment and Plan:  Severe aortic valve stenosis Mild aortic valve regurgitation Mild MS/mild MR - Asymptomatic. - METs more than 4, physically active at baseline but limited recently due to knee issues.  BNP mildly elevated a few months ago due to which she was referred to structural cardiology for further evaluation.  Per chart review, patient wanted to wait until asymptomatic before he undergoes valve replacement. - Physical exam findings consistent with severe AS. - ER precautions for severe AS provided. - Repeat echocardiogram in 6 months. - Return to follow-up in 6 months.  CAD manifested by ACS in 2009 s/p emergent 5v CABG (LIMA to LAD, SVG to diagonal, sequential SVG to OM1 and OM 2, SVG to RPDA) with LVEF 40 to 45% (45% at the time of CABG), currently angina free - Continue aspirin  81 mg once daily. - Not interested to be on statin due to side effects.  Moderate pulmonary hypertension in 2024 - Asymptomatic. - Continue to monitor.  HTN with an element of whitecoat HTN -  Home blood pressures range between 130 and 150 mmHg. - Continue valsartan 320 mg once daily. - Previously did not tolerate hydralazine.   30 minutes spent in reviewing prior medical records, reports, more than 3 labs, discussion and documentation  Disposition:  Follow up 6 months  Signed Jaan Fischel Priya Daisuke Bailey, MD, 03/13/2024 11:37 AM    [facility] [address] [place], [place], [place] [address]  "

## 2024-03-19 ENCOUNTER — Telehealth: Payer: Self-pay

## 2024-03-19 NOTE — Telephone Encounter (Signed)
 Patient plans to see Dr. Mallipeddi in 08/2024 after his 08/28/24 echo. We called to offer an appointment with Dr. Wendel in 05/2024 for a 3 month follow up but the patient declined. He wishes to just follow up with Dr. Mallipeddi for now.

## 2024-08-28 ENCOUNTER — Ambulatory Visit
